# Patient Record
Sex: Male | Born: 1965 | Race: White | Hispanic: No | Marital: Married | Smoking: Never smoker
Health system: Southern US, Community
[De-identification: ages and names within clinical notes are randomized; demographics above are authoritative.]

## PROBLEM LIST (undated history)

## (undated) HISTORY — PX: CHOLECYSTECTOMY: SHX55

## (undated) HISTORY — PX: ANKLE SURGERY: SHX546

## (undated) HISTORY — PX: TONSILLECTOMY: SUR1361

---

## 2018-06-06 ENCOUNTER — Ambulatory Visit: Payer: Self-pay | Admitting: Cardiology

## 2018-06-06 DIAGNOSIS — I77810 Thoracic aortic ectasia: Secondary | ICD-10-CM

## 2018-06-06 DIAGNOSIS — R079 Chest pain, unspecified: Secondary | ICD-10-CM | POA: Diagnosis not present

## 2018-06-06 DIAGNOSIS — E782 Mixed hyperlipidemia: Secondary | ICD-10-CM

## 2018-06-06 DIAGNOSIS — I10 Essential (primary) hypertension: Secondary | ICD-10-CM | POA: Diagnosis not present

## 2018-06-06 DIAGNOSIS — I2510 Atherosclerotic heart disease of native coronary artery without angina pectoris: Secondary | ICD-10-CM

## 2018-06-13 ENCOUNTER — Encounter: Payer: Self-pay | Admitting: Cardiology

## 2018-06-13 ENCOUNTER — Ambulatory Visit (INDEPENDENT_AMBULATORY_CARE_PROVIDER_SITE_OTHER): Payer: 59 | Admitting: Cardiology

## 2018-06-13 VITALS — BP 124/72 | HR 84 | Ht 67.0 in | Wt 200.0 lb

## 2018-06-13 DIAGNOSIS — I10 Essential (primary) hypertension: Secondary | ICD-10-CM

## 2018-06-13 DIAGNOSIS — R0609 Other forms of dyspnea: Secondary | ICD-10-CM

## 2018-06-13 DIAGNOSIS — R0600 Dyspnea, unspecified: Secondary | ICD-10-CM

## 2018-06-13 DIAGNOSIS — E781 Pure hyperglyceridemia: Secondary | ICD-10-CM

## 2018-06-13 HISTORY — DX: Dyspnea, unspecified: R06.00

## 2018-06-13 HISTORY — DX: Essential (primary) hypertension: I10

## 2018-06-13 HISTORY — DX: Other forms of dyspnea: R06.09

## 2018-06-13 MED ORDER — NITROGLYCERIN 0.4 MG SL SUBL: 0.4000 mg | SUBLINGUAL_TABLET | SUBLINGUAL | 11 refills | Status: DC | PRN

## 2018-06-13 MED ORDER — FISH OIL 1000 MG PO CAPS: 2000.0000 mg | ORAL_CAPSULE | Freq: Two times a day (BID) | ORAL | 2 refills | Status: AC

## 2018-06-13 MED ORDER — ASPIRIN EC 81 MG PO TBEC: 81.0000 mg | DELAYED_RELEASE_TABLET | Freq: Every day | ORAL | 3 refills | Status: AC

## 2018-06-13 NOTE — H&P (View-Only) (Signed)
Cardiology Office Note:    Date:  06/13/2018   ID:  Jason Holland, DOB 1965-08-12, MRN [id number]  PCP:  Mikael Spray, NP  Cardiologist:  Garwin Brothers, MD    Referring MD: Mikael Spray, NP    ASSESSMENT:    1. DOE (dyspnea on exertion)   2. Essential hypertension   3. Hypertriglyceridemia    PLAN:    In order of problems listed above:  1. Secondary prevention stressed with the patient.  Importance of compliance with diet and medication stressed and he vocalized understanding.  His blood pressure is stable.  Diet was discussed for dyslipidemia. 2. I reviewed [facility] hospital records extensively.  Patient has significant coronary calcifications on the chest CT.  His symptoms have recurred. 3. Sublingual nitroglycerin prescription was sent, its protocol and 911 protocol explained and the patient vocalized understanding questions were answered to the patient's satisfaction 4. Diet was discussed extensively for dyslipidemia and I prescribed in addition to fish oil 2 g twice daily we will recheck his lipids in the next month or 2. 5. In view of very significant symptoms I discussed with the following. 6. I discussed coronary angiography and left heart catheterization with the patient at extensive length. Procedure, benefits and potential risks were explained. Patient had multiple questions which were answered to the patient's satisfaction. Patient agreed and consented for the procedure. Further recommendations will be made based on the findings of the coronary angiography. In the interim. The patient has any significant symptoms he knows to go to the nearest emergency room. 7. He was advised to take to coated baby aspirin's on a daily basis until his coronary angiography.   Medication Adjustments/Labs and Tests Ordered: Current medicines are reviewed at length with the patient today.  Concerns regarding medicines are outlined above.  No orders of the defined types were placed in  this encounter.  No orders of the defined types were placed in this encounter.    History of Present Illness:    Jason Holland is a 52 y.o. male who is being seen today for the evaluation of chest tightness on exertion at the request of Mikael Spray, NP.  Patient is a pleasant 52 year old male.  He is seen here accompanied by his wife.  He has just of essential hypertension and market hypertriglyceridemia.  He leads a sedentary lifestyle.  Patient mentions to me that he went to the hospital for crushing chest pain.  He called the EMS for this.  He was given nitroglycerin with almost resolution of his symptoms.  Subsequently he went to the hospital and underwent stress testing and this did not reveal any evidence of ischemia.  Patient has significant coronary artery calcification on a CT scan report that is available to me.  His stress test was unremarkable and he was discharged home.  He was found to have a mildly dilated aortic root.  At the time of my evaluation, the patient is alert awake oriented and in no distress.  The patient mentions to me that the symptoms have recurred after hospital discharge but to a milder degree.  History reviewed. No pertinent past medical history.  Past Surgical History:  Procedure Laterality Date  . ANKLE SURGERY    . CHOLECYSTECTOMY    . TONSILLECTOMY      Current Medications: Current Meds  Medication Sig  . albuterol (PROVENTIL HFA;VENTOLIN HFA) 108 (90 Base) MCG/ACT inhaler Inhale 2 puffs into the lungs as needed.  Marland Kitchen atorvastatin (LIPITOR) 40 MG tablet  Take 1 tablet by mouth daily.  . hydrochlorothiazide (HYDRODIURIL) 25 MG tablet Take 1 tablet by mouth daily.  Marland Kitchen. lisinopril (PRINIVIL,ZESTRIL) 20 MG tablet Take 1 tablet by mouth daily.     Allergies:   Patient has no known allergies.   Social History   Socioeconomic History  . Marital status: Married    Spouse name: Not on file  . Number of children: Not on file  . Years of education: Not on  file  . Highest education level: Not on file  Occupational History  . Not on file  Social Needs  . Financial resource strain: Not on file  . Food insecurity:    Worry: Not on file    Inability: Not on file  . Transportation needs:    Medical: Not on file    Non-medical: Not on file  Tobacco Use  . Smoking status: Never Smoker  . Smokeless tobacco: Never Used  Substance and Sexual Activity  . Alcohol use: Not on file  . Drug use: Not on file  . Sexual activity: Not on file  Lifestyle  . Physical activity:    Days per week: Not on file    Minutes per session: Not on file  . Stress: Not on file  Relationships  . Social connections:    Talks on phone: Not on file    Gets together: Not on file    Attends religious service: Not on file    Active member of club or organization: Not on file    Attends meetings of clubs or organizations: Not on file    Relationship status: Not on file  Other Topics Concern  . Not on file  Social History Narrative  . Not on file     Family History: The patient's family history includes Heart Problems in his father and mother; Hypertension in his father and mother.  ROS:   Please see the history of present illness.    All other systems reviewed and are negative.  EKGs/Labs/Other Studies Reviewed:    The following studies were reviewed today: I discussed my findings with the patient at extensive length.  Hospital records were reviewed extensively.   Recent Labs: No results found for requested labs within last 8760 hours.  Recent Lipid Panel No results found for: CHOL, TRIG, HDL, CHOLHDL, VLDL, LDLCALC, LDLDIRECT  Physical Exam:    VS:  BP 124/72 (BP Location: Right Arm, Patient Position: Sitting, Cuff Size: Normal)   Pulse 84   Ht 5\' 7"  (1.702 m)   Wt 200 lb (90.7 kg)   SpO2 99%   BMI 31.32 kg/m     Wt Readings from Last 3 Encounters:  06/13/18 200 lb (90.7 kg)     GEN: Patient is in no acute distress HEENT: Normal NECK: No  JVD; No carotid bruits LYMPHATICS: No lymphadenopathy CARDIAC: S1 S2 regular, 2/6 systolic murmur at the apex. RESPIRATORY:  Clear to auscultation without rales, wheezing or rhonchi  ABDOMEN: Soft, non-tender, non-distended MUSCULOSKELETAL:  No edema; No deformity  SKIN: Warm and dry NEUROLOGIC:  Alert and oriented x 3 PSYCHIATRIC:  Normal affect    Signed, Garwin Brothersajan R Seraj Dunnam, MD  06/13/2018 1:54 PM    [facility]

## 2018-06-13 NOTE — Progress Notes (Signed)
Cardiology Office Note:    Date:  06/13/2018   ID:  Jason Holland, DOB 12/05/1965, MRN [id number]  PCP:  McRae, Lauren, NP  Cardiologist:  Layce Sprung R Asyia Hornung, MD    Referring MD: McRae, Lauren, NP    ASSESSMENT:    1. DOE (dyspnea on exertion)   2. Essential hypertension   3. Hypertriglyceridemia    PLAN:    In order of problems listed above:  1. Secondary prevention stressed with the patient.  Importance of compliance with diet and medication stressed and he vocalized understanding.  His blood pressure is stable.  Diet was discussed for dyslipidemia. 2. I reviewed [place] hospital records extensively.  Patient has significant coronary calcifications on the chest CT.  His symptoms have recurred. 3. Sublingual nitroglycerin prescription was sent, its protocol and 911 protocol explained and the patient vocalized understanding questions were answered to the patient's satisfaction 4. Diet was discussed extensively for dyslipidemia and I prescribed in addition to fish oil 2 g twice daily we will recheck his lipids in the next month or 2. 5. In view of very significant symptoms I discussed with the following. 6. I discussed coronary angiography and left heart catheterization with the patient at extensive length. Procedure, benefits and potential risks were explained. Patient had multiple questions which were answered to the patient's satisfaction. Patient agreed and consented for the procedure. Further recommendations will be made based on the findings of the coronary angiography. In the interim. The patient has any significant symptoms he knows to go to the nearest emergency room. 7. He was advised to take to coated baby aspirin's on a daily basis until his coronary angiography.   Medication Adjustments/Labs and Tests Ordered: Current medicines are reviewed at length with the patient today.  Concerns regarding medicines are outlined above.  No orders of the defined types were placed in  this encounter.  No orders of the defined types were placed in this encounter.    History of Present Illness:    Jason Holland is a 52 y.o. male who is being seen today for the evaluation of chest tightness on exertion at the request of McRae, Lauren, NP.  Patient is a pleasant 52-year-old male.  He is seen here accompanied by his wife.  He has just of essential hypertension and market hypertriglyceridemia.  He leads a sedentary lifestyle.  Patient mentions to me that he went to the hospital for crushing chest pain.  He called the EMS for this.  He was given nitroglycerin with almost resolution of his symptoms.  Subsequently he went to the hospital and underwent stress testing and this did not reveal any evidence of ischemia.  Patient has significant coronary artery calcification on a CT scan report that is available to me.  His stress test was unremarkable and he was discharged home.  He was found to have a mildly dilated aortic root.  At the time of my evaluation, the patient is alert awake oriented and in no distress.  The patient mentions to me that the symptoms have recurred after hospital discharge but to a milder degree.  History reviewed. No pertinent past medical history.  Past Surgical History:  Procedure Laterality Date  . ANKLE SURGERY    . CHOLECYSTECTOMY    . TONSILLECTOMY      Current Medications: Current Meds  Medication Sig  . albuterol (PROVENTIL HFA;VENTOLIN HFA) 108 (90 Base) MCG/ACT inhaler Inhale 2 puffs into the lungs as needed.  . atorvastatin (LIPITOR) 40 MG tablet   Take 1 tablet by mouth daily.  . hydrochlorothiazide (HYDRODIURIL) 25 MG tablet Take 1 tablet by mouth daily.  . lisinopril (PRINIVIL,ZESTRIL) 20 MG tablet Take 1 tablet by mouth daily.     Allergies:   Patient has no known allergies.   Social History   Socioeconomic History  . Marital status: Married    Spouse name: Not on file  . Number of children: Not on file  . Years of education: Not on  file  . Highest education level: Not on file  Occupational History  . Not on file  Social Needs  . Financial resource strain: Not on file  . Food insecurity:    Worry: Not on file    Inability: Not on file  . Transportation needs:    Medical: Not on file    Non-medical: Not on file  Tobacco Use  . Smoking status: Never Smoker  . Smokeless tobacco: Never Used  Substance and Sexual Activity  . Alcohol use: Not on file  . Drug use: Not on file  . Sexual activity: Not on file  Lifestyle  . Physical activity:    Days per week: Not on file    Minutes per session: Not on file  . Stress: Not on file  Relationships  . Social connections:    Talks on phone: Not on file    Gets together: Not on file    Attends religious service: Not on file    Active member of club or organization: Not on file    Attends meetings of clubs or organizations: Not on file    Relationship status: Not on file  Other Topics Concern  . Not on file  Social History Narrative  . Not on file     Family History: The patient's family history includes Heart Problems in his father and mother; Hypertension in his father and mother.  ROS:   Please see the history of present illness.    All other systems reviewed and are negative.  EKGs/Labs/Other Studies Reviewed:    The following studies were reviewed today: I discussed my findings with the patient at extensive length.  Hospital records were reviewed extensively.   Recent Labs: No results found for requested labs within last 8760 hours.  Recent Lipid Panel No results found for: CHOL, TRIG, HDL, CHOLHDL, VLDL, LDLCALC, LDLDIRECT  Physical Exam:    VS:  BP 124/72 (BP Location: Right Arm, Patient Position: Sitting, Cuff Size: Normal)   Pulse 84   Ht 5' 7" (1.702 m)   Wt 200 lb (90.7 kg)   SpO2 99%   BMI 31.32 kg/m     Wt Readings from Last 3 Encounters:  06/13/18 200 lb (90.7 kg)     GEN: Patient is in no acute distress HEENT: Normal NECK: No  JVD; No carotid bruits LYMPHATICS: No lymphadenopathy CARDIAC: S1 S2 regular, 2/6 systolic murmur at the apex. RESPIRATORY:  Clear to auscultation without rales, wheezing or rhonchi  ABDOMEN: Soft, non-tender, non-distended MUSCULOSKELETAL:  No edema; No deformity  SKIN: Warm and dry NEUROLOGIC:  Alert and oriented x 3 PSYCHIATRIC:  Normal affect    Signed, Beni Turrell R Lisa Milian, MD  06/13/2018 1:54 PM    [facility]   

## 2018-06-13 NOTE — Patient Instructions (Signed)
Medication Instructions:  Your physician has recommended you make the following change in your medication:   START 81 mg enteric coated aspirin daily START Nitroglycerin 0.4 mg sublingual (under your tongue) as needed for chest pain. If experiencing chest pain, stop what you are doing and sit down. Take 1 nitroglycerin and wait 5 minutes. If chest pain continues, take another nitroglycerin and wait 5 minutes. If chest pain does not subside, take 1 more nitroglycerin and dial 911. You make take a total of 3 nitroglycerin in a 15 minute time frame. START 2,000 mg fish oil twice daily (this is an over the counter drug)  If you need a refill on your cardiac medications before your next appointment, please call your pharmacy.   Lab work: Your physician recommends that you have the following labs drawn: CBC and BMP today.  If you have labs (blood work) drawn today and your tests are completely normal, you will receive your results only by: Marland Kitchen MyChart Message (if you have MyChart) OR . A paper copy in the mail If you have any lab test that is abnormal or we need to change your treatment, we will call you to review the results.  Testing/Procedures:    [facility] CARDIOVASCULAR DIVISION [facility] AT [place] [address] [place] [address] Dept: [phone] Loc: [phone]  Jason Holland  06/13/2018  You are scheduled for a Cardiac Catheterization on Tuesday, December 3 with Dr. Verne Carrow.  1. Please arrive at the [facility] (Main Entrance A) at [facility]: [address] [place], [place] [address] at 7:00 AM (This time is two hours before your procedure to ensure your preparation). Free valet parking service is available.   Special note: Every effort is made to have your procedure done on time. Please understand that emergencies sometimes delay scheduled procedures.  2. Diet: Do not eat solid foods after midnight.  The  patient may have clear liquids until 5am upon the day of the procedure.  3. Labs: To be done today.  4. Medication instructions in preparation for your procedure:   Contrast Allergy: No  Stop taking, HTCZ (Hydrochlorothiazide) Tuesday, December 3,  On the morning of your procedure, take your Aspirin and any morning medicines NOT listed above.  You may use sips of water.  5. Plan for one night stay--bring personal belongings. 6. Bring a current list of your medications and current insurance cards. 7. You MUST have a responsible person to drive you home. 8. Someone MUST be with you the first 24 hours after you arrive home or your discharge will be delayed. 9. Please wear clothes that are easy to get on and off and wear slip-on shoes.  Thank you for allowing [place] to care for you!   -- [facility]   Follow-Up: At [facility], you and your health needs are our priority.  As part of our continuing mission to provide you with exceptional heart care, we have created designated Provider Care Teams.  These Care Teams include your primary Cardiologist (physician) and Advanced Practice Providers (APPs -  Physician Assistants and Nurse Practitioners) who all work together to provide you with the care you need, when you need it.  You will need a follow up appointment in 4 weeks.  Please call our office 2 months in advance to schedule this appointment.  You may see another member of our [employer] Provider Team in [place]: Gypsy Balsam, MD . Arlys John  Dulce Sellar, MD  Any Other Special Instructions Will Be Listed Below (If Applicable).   Coronary Angiogram With Stent Coronary angiogram with stent placement is a procedure to widen or open a narrow blood vessel of the heart (coronary artery). Arteries may become blocked by cholesterol buildup (plaques) in the lining of the wall. When a coronary artery becomes partially blocked, blood flow to that area decreases. This  may lead to chest pain or a heart attack (myocardial infarction). A stent is a small piece of metal that looks like mesh or a spring. Stent placement may be done as treatment for a heart attack or right after a coronary angiogram in which a blocked artery is found. Let your health care provider know about:  Any allergies you have.  All medicines you are taking, including vitamins, herbs, eye drops, creams, and over-the-counter medicines.  Any problems you or family members have had with anesthetic medicines.  Any blood disorders you have.  Any surgeries you have had.  Any medical conditions you have.  Whether you are pregnant or may be pregnant. What are the risks? Generally, this is a safe procedure. However, problems may occur, including:  Damage to the heart or its blood vessels.  A return of blockage.  Bleeding, infection, or bruising at the insertion site.  A collection of blood under the skin (hematoma) at the insertion site.  A blood clot in another part of the body.  Kidney injury.  Allergic reaction to the dye or contrast that is used.  Bleeding into the abdomen (retroperitoneal bleeding).  What happens before the procedure? Staying hydrated Follow instructions from your health care provider about hydration, which may include:  Up to 2 hours before the procedure - you may continue to drink clear liquids, such as water, clear fruit juice, black coffee, and plain tea.  Eating and drinking restrictions Follow instructions from your health care provider about eating and drinking, which may include:  8 hours before the procedure - stop eating heavy meals or foods such as meat, fried foods, or fatty foods.  6 hours before the procedure - stop eating light meals or foods, such as toast or cereal.  2 hours before the procedure - stop drinking clear liquids.  Ask your health care provider about:  Changing or stopping your regular medicines. This is especially  important if you are taking diabetes medicines or blood thinners.  Taking medicines such as ibuprofen. These medicines can thin your blood. Do not take these medicines before your procedure if your health care provider instructs you not to. Generally, aspirin is recommended before a procedure of passing a small, thin tube (catheter) through a blood vessel and into the heart (cardiac catheterization).  What happens during the procedure?  An IV tube will be inserted into one of your veins.  You will be given one or more of the following: ? A medicine to help you relax (sedative). ? A medicine to numb the area where the catheter will be inserted into an artery (local anesthetic).  To reduce your risk of infection: ? Your health care team will wash or sanitize their hands. ? Your skin will be washed with soap. ? Hair may be removed from the area where the catheter will be inserted.  Using a guide wire, the catheter will be inserted into an artery. The location may be in your groin, in your wrist, or in the fold of your arm (near your elbow).  A type of X-ray (fluoroscopy) will be  used to help guide the catheter to the opening of the arteries in the heart.  A dye will be injected into the catheter, and X-rays will be taken. The dye will help to show where any narrowing or blockages are located in the arteries.  A tiny wire will be guided to the blocked spot, and a balloon will be inflated to make the artery wider.  The stent will be expanded and will crush the plaques into the wall of the vessel. The stent will hold the area open and improve the blood flow. Most stents have a drug coating to reduce the risk of the stent narrowing over time.  The artery may be made wider using a drill, laser, or other tools to remove plaques.  When the blood flow is better, the catheter will be removed. The lining of the artery will grow over the stent, which stays where it was placed. This procedure may vary  among health care providers and hospitals. What happens after the procedure?  If the procedure is done through the leg, you will be kept in bed lying flat for about 6 hours. You will be instructed to not bend and not cross your legs.  The insertion site will be checked frequently.  The pulse in your foot or wrist will be checked frequently.  You may have additional blood tests, X-rays, and a test that records the electrical activity of your heart (electrocardiogram, or ECG). This information is not intended to replace advice given to you by your health care provider. Make sure you discuss any questions you have with your health care provider. Document Released: 01/08/2003 Document Revised: 03/03/2016 Document Reviewed: 02/07/2016 Elsevier Interactive Patient Education  2018 [employer].    Aspirin and Your Heart Aspirin is a medicine that affects the way blood clots. Aspirin can be used to help reduce the risk of blood clots, heart attacks, and other heart-related problems. Should I take aspirin? Your health care provider will help you determine whether it is safe and beneficial for you to take aspirin daily. Taking aspirin daily may be beneficial if you:  Have had a heart attack or chest pain.  Have undergone open heart surgery such as coronary artery bypass surgery (CABG).  Have had coronary angioplasty.  Have experienced a stroke or transient ischemic attack (TIA).  Have peripheral vascular disease (PVD).  Have chronic heart rhythm problems such as atrial fibrillation.  Are there any risks of taking aspirin daily? Daily use of aspirin can increase your risk of side effects. Some of these include:  Bleeding. Bleeding problems can be minor or serious. An example of a minor problem is a cut that does not stop bleeding. An example of a more serious problem is stomach bleeding or bleeding into the brain. Your risk of bleeding is increased if you are also taking non-steroidal  anti-inflammatory medicine (NSAIDs).  Increased bruising.  Upset stomach.  An allergic reaction. People who have nasal polyps have an increased risk of developing an aspirin allergy.  What are some guidelines I should follow when taking aspirin?  Take aspirin only as directed by your health care provider. Make sure you understand how much you should take and what form you should take. The two forms of aspirin are: ? Non-enteric-coated. This type of aspirin does not have a coating and is absorbed quickly. Non-enteric-coated aspirin is usually recommended for people with chest pain. This type of aspirin also comes in a chewable form. ? Enteric-coated. This type of aspirin  has a special coating that releases the medicine very slowly. Enteric-coated aspirin causes less stomach upset than non-enteric-coated aspirin. This type of aspirin should not be chewed or crushed.  Drink alcohol in moderation. Drinking alcohol increases your risk of bleeding. When should I seek medical care?  You have unusual bleeding or bruising.  You have stomach pain.  You have an allergic reaction. Symptoms of an allergic reaction include: ? Hives. ? Itchy skin. ? Swelling of the lips, tongue, or face.  You have ringing in your ears. When should I seek immediate medical care?  Your bowel movements are bloody, dark red, or black in color.  You vomit or cough up blood.  You have blood in your urine.  You cough, wheeze, or feel short of breath. If you have any of the following symptoms, this is an emergency. Do not wait to see if the pain will go away. Get medical help at once. Call your local emergency services (911 in the [place] Do not drive yourself to the hospital.  You have severe chest pain, especially if the pain is crushing or pressure-like and spreads to the arms, back, neck, or jaw.  You have stroke-like symptoms, such as: ? Loss of vision. ? Difficulty talking. ? Numbness or weakness on one side  of your body. ? Numbness or weakness in your arm or leg. ? Not thinking clearly or feeling confused.  This information is not intended to replace advice given to you by your health care provider. Make sure you discuss any questions you have with your health care provider. Document Released: 06/16/2008 Document Revised: 11/11/2015 Document Reviewed: 10/09/2013 Elsevier Interactive Patient Education  2018 [employer]. Nitroglycerin sublingual tablets What is this medicine? NITROGLYCERIN (nye troe GLI ser in) is a type of vasodilator. It relaxes blood vessels, increasing the blood and oxygen supply to your heart. This medicine is used to relieve chest pain caused by angina. It is also used to prevent chest pain before activities like climbing stairs, going outdoors in cold weather, or sexual activity. This medicine may be used for other purposes; ask your health care provider or pharmacist if you have questions. COMMON BRAND NAME(S): Nitroquick, Nitrostat, Nitrotab What should I tell my health care provider before I take this medicine? They need to know if you have any of these conditions: -anemia -head injury, recent stroke, or bleeding in the brain -liver disease -previous heart attack -an unusual or allergic reaction to nitroglycerin, other medicines, foods, dyes, or preservatives -pregnant or trying to get pregnant -breast-feeding How should I use this medicine? Take this medicine by mouth as needed. At the first sign of an angina attack (chest pain or tightness) place one tablet under your tongue. You can also take this medicine 5 to 10 minutes before an event likely to produce chest pain. Follow the directions on the prescription label. Let the tablet dissolve under the tongue. Do not swallow whole. Replace the dose if you accidentally swallow it. It will help if your mouth is not dry. Saliva around the tablet will help it to dissolve more quickly. Do not eat or drink, smoke or chew  tobacco while a tablet is dissolving. If you are not better within 5 minutes after taking ONE dose of nitroglycerin, call 9-1-1 immediately to seek emergency medical care. Do not take more than 3 nitroglycerin tablets over 15 minutes. If you take this medicine often to relieve symptoms of angina, your doctor or health care professional may provide you with different  instructions to manage your symptoms. If symptoms do not go away after following these instructions, it is important to call 9-1-1 immediately. Do not take more than 3 nitroglycerin tablets over 15 minutes. Talk to your pediatrician regarding the use of this medicine in children. Special care may be needed. Overdosage: If you think you have taken too much of this medicine contact a poison control center or emergency room at once. NOTE: This medicine is only for you. Do not share this medicine with others. What if I miss a dose? This does not apply. This medicine is only used as needed. What may interact with this medicine? Do not take this medicine with any of the following medications: -certain migraine medicines like ergotamine and dihydroergotamine (DHE) -medicines used to treat erectile dysfunction like sildenafil, tadalafil, and vardenafil -riociguat This medicine may also interact with the following medications: -alteplase -aspirin -heparin -medicines for high blood pressure -medicines for mental depression -other medicines used to treat angina -phenothiazines like chlorpromazine, mesoridazine, prochlorperazine, thioridazine This list may not describe all possible interactions. Give your health care provider a list of all the medicines, herbs, non-prescription drugs, or dietary supplements you use. Also tell them if you smoke, drink alcohol, or use illegal drugs. Some items may interact with your medicine. What should I watch for while using this medicine? Tell your doctor or health care professional if you feel your medicine  is no longer working. Keep this medicine with you at all times. Sit or lie down when you take your medicine to prevent falling if you feel dizzy or faint after using it. Try to remain calm. This will help you to feel better faster. If you feel dizzy, take several deep breaths and lie down with your feet propped up, or bend forward with your head resting between your knees. You may get drowsy or dizzy. Do not drive, use machinery, or do anything that needs mental alertness until you know how this drug affects you. Do not stand or sit up quickly, especially if you are an older patient. This reduces the risk of dizzy or fainting spells. Alcohol can make you more drowsy and dizzy. Avoid alcoholic drinks. Do not treat yourself for coughs, colds, or pain while you are taking this medicine without asking your doctor or health care professional for advice. Some ingredients may increase your blood pressure. What side effects may I notice from receiving this medicine? Side effects that you should report to your doctor or health care professional as soon as possible: -blurred vision -dry mouth -skin rash -sweating -the feeling of extreme pressure in the head -unusually weak or tired Side effects that usually do not require medical attention (report to your doctor or health care professional if they continue or are bothersome): -flushing of the face or neck -headache -irregular heartbeat, palpitations -nausea, vomiting This list may not describe all possible side effects. Call your doctor for medical advice about side effects. You may report side effects to FDA at 1-800-FDA-1088. Where should I keep my medicine? Keep out of the reach of children. Store at room temperature between 20 and 25 degrees C (68 and 77 degrees F). Store in Retail buyeroriginal container. Protect from light and moisture. Keep tightly closed. Throw away any unused medicine after the expiration date. NOTE: This sheet is a summary. It may not cover  all possible information. If you have questions about this medicine, talk to your doctor, pharmacist, or health care provider.  2018 Elsevier/Gold Standard (2013-05-02 17:57:36)

## 2018-06-13 NOTE — Addendum Note (Signed)
Addended by: Craige CottaANDERSON, Ilhan Madan S on: 06/13/2018 02:12 PM   Modules accepted: Orders

## 2018-06-14 LAB — CBC WITH DIFFERENTIAL/PLATELET
BASOS: 0 %
Basophils Absolute: 0 10*3/uL (ref 0.0–0.2)
EOS (ABSOLUTE): 0.1 10*3/uL (ref 0.0–0.4)
Eos: 1 %
Hematocrit: 43.2 % (ref 37.5–51.0)
Hemoglobin: 15 g/dL (ref 13.0–17.7)
Immature Grans (Abs): 0 10*3/uL (ref 0.0–0.1)
Immature Granulocytes: 0 %
LYMPHS ABS: 1.7 10*3/uL (ref 0.7–3.1)
Lymphs: 17 %
MCH: 30.7 pg (ref 26.6–33.0)
MCHC: 34.7 g/dL (ref 31.5–35.7)
MCV: 88 fL (ref 79–97)
MONOS ABS: 0.5 10*3/uL (ref 0.1–0.9)
Monocytes: 5 %
NEUTROS ABS: 7.4 10*3/uL — AB (ref 1.4–7.0)
Neutrophils: 77 %
Platelets: 271 10*3/uL (ref 150–450)
RBC: 4.89 x10E6/uL (ref 4.14–5.80)
RDW: 12.6 % (ref 12.3–15.4)
WBC: 9.8 10*3/uL (ref 3.4–10.8)

## 2018-06-14 LAB — BASIC METABOLIC PANEL
BUN / CREAT RATIO: 28 — AB (ref 9–20)
BUN: 29 mg/dL — ABNORMAL HIGH (ref 6–24)
CO2: 28 mmol/L (ref 20–29)
CREATININE: 1.04 mg/dL (ref 0.76–1.27)
Calcium: 10.2 mg/dL (ref 8.7–10.2)
Chloride: 97 mmol/L (ref 96–106)
GFR calc non Af Amer: 81 mL/min/{1.73_m2} (ref >59)
GFR, EST AFRICAN AMERICAN: 94 mL/min/{1.73_m2} (ref >59)
Glucose: 106 mg/dL — ABNORMAL HIGH (ref 65–99)
POTASSIUM: 4.7 mmol/L (ref 3.5–5.2)
SODIUM: 142 mmol/L (ref 134–144)

## 2018-06-18 ENCOUNTER — Telehealth: Payer: Self-pay

## 2018-06-18 ENCOUNTER — Telehealth: Payer: Self-pay | Admitting: *Deleted

## 2018-06-18 NOTE — Telephone Encounter (Signed)
Pt [facility]: Tuesday June 19, 2018 9 AM [facility] at: 7 AM  No solid food after midnight prior to cath, clear liquids until 5 AM day of procedure. Contrast allergy: no  Hold: HCTZ-AM of procedure.  Except hold medications AM meds can be  taken pre-cath with sip of water including: ASA 81 mg  Confirmed patient has responsible person to drive home post procedure and for 24 hours after you arrive home: yes

## 2018-06-18 NOTE — Telephone Encounter (Signed)
-----   Message from Garwin Brothersajan R Revankar, MD sent at 06/18/2018 10:54 AM EST ----- The results of the study is unremarkable. Please inform patient. I will discuss in detail at next appointment. Cc  primary care/referring physician Garwin Brothersajan R Revankar, MD 06/18/2018 10:54 AM

## 2018-06-18 NOTE — Telephone Encounter (Signed)
Patient called and informed of lab results.

## 2018-06-19 ENCOUNTER — Ambulatory Visit (HOSPITAL_COMMUNITY)
Admission: RE | Admit: 2018-06-19 | Discharge: 2018-06-19 | Disposition: A | Discharge status: Home / Self Care | Payer: 59 | Source: Ambulatory Visit | Attending: Cardiovascular Disease | Admitting: Cardiovascular Disease

## 2018-06-19 ENCOUNTER — Encounter (HOSPITAL_COMMUNITY)
Admission: RE | Disposition: A | Discharge status: Home / Self Care | Payer: Self-pay | Source: Ambulatory Visit | Attending: Cardiovascular Disease

## 2018-06-19 ENCOUNTER — Other Ambulatory Visit: Payer: Self-pay

## 2018-06-19 ENCOUNTER — Encounter (HOSPITAL_COMMUNITY): Payer: Self-pay | Admitting: Cardiovascular Disease

## 2018-06-19 DIAGNOSIS — E781 Pure hyperglyceridemia: Secondary | ICD-10-CM | POA: Insufficient documentation

## 2018-06-19 DIAGNOSIS — E785 Hyperlipidemia, unspecified: Secondary | ICD-10-CM | POA: Insufficient documentation

## 2018-06-19 DIAGNOSIS — R072 Precordial pain: Secondary | ICD-10-CM

## 2018-06-19 DIAGNOSIS — R0609 Other forms of dyspnea: Secondary | ICD-10-CM

## 2018-06-19 DIAGNOSIS — I10 Essential (primary) hypertension: Secondary | ICD-10-CM

## 2018-06-19 DIAGNOSIS — I25110 Atherosclerotic heart disease of native coronary artery with unstable angina pectoris: Secondary | ICD-10-CM | POA: Diagnosis not present

## 2018-06-19 DIAGNOSIS — Z8249 Family history of ischemic heart disease and other diseases of the circulatory system: Secondary | ICD-10-CM | POA: Diagnosis not present

## 2018-06-19 HISTORY — PX: LEFT HEART CATH AND CORONARY ANGIOGRAPHY: CATH118249

## 2018-06-19 SURGERY — LEFT HEART CATH AND CORONARY ANGIOGRAPHY
Anesthesia: LOCAL

## 2018-06-19 MED ORDER — HEPARIN (PORCINE) IN NACL 1000-0.9 UT/500ML-% IV SOLN
INTRAVENOUS | Status: AC
  Filled 2018-06-19: qty 500

## 2018-06-19 MED ORDER — MIDAZOLAM HCL 2 MG/2ML IJ SOLN
INTRAMUSCULAR | Status: AC
  Filled 2018-06-19: qty 2

## 2018-06-19 MED ORDER — SODIUM CHLORIDE 0.9% FLUSH: 3.0000 mL | INTRAVENOUS | Status: DC | PRN

## 2018-06-19 MED ORDER — HEPARIN (PORCINE) IN NACL 1000-0.9 UT/500ML-% IV SOLN
INTRAVENOUS | Status: DC | PRN
  Administered 2018-06-19: 500 mL

## 2018-06-19 MED ORDER — SODIUM CHLORIDE 0.9% FLUSH: 3.0000 mL | Freq: Two times a day (BID) | INTRAVENOUS | Status: DC

## 2018-06-19 MED ORDER — ONDANSETRON HCL 4 MG/2ML IJ SOLN: 4.0000 mg | Freq: Four times a day (QID) | INTRAMUSCULAR | Status: DC | PRN

## 2018-06-19 MED ORDER — HEPARIN (PORCINE) IN NACL 1000-0.9 UT/500ML-% IV SOLN
INTRAVENOUS | Status: AC
  Filled 2018-06-19: qty 1000

## 2018-06-19 MED ORDER — SODIUM CHLORIDE 0.9 % IV SOLN: INTRAVENOUS | Status: AC

## 2018-06-19 MED ORDER — HEPARIN SODIUM (PORCINE) 1000 UNIT/ML IJ SOLN
INTRAMUSCULAR | Status: AC
  Filled 2018-06-19: qty 1

## 2018-06-19 MED ORDER — SODIUM CHLORIDE 0.9 % IV SOLN: 250.0000 mL | INTRAVENOUS | Status: DC | PRN

## 2018-06-19 MED ORDER — VERAPAMIL HCL 2.5 MG/ML IV SOLN
INTRAVENOUS | Status: AC
  Filled 2018-06-19: qty 2

## 2018-06-19 MED ORDER — HEPARIN SODIUM (PORCINE) 1000 UNIT/ML IJ SOLN
INTRAMUSCULAR | Status: DC | PRN
  Administered 2018-06-19: 4500 [IU] via INTRAVENOUS

## 2018-06-19 MED ORDER — FENTANYL CITRATE (PF) 100 MCG/2ML IJ SOLN
INTRAMUSCULAR | Status: DC | PRN
  Administered 2018-06-19: 50 ug via INTRAVENOUS

## 2018-06-19 MED ORDER — LIDOCAINE HCL (PF) 1 % IJ SOLN
INTRAMUSCULAR | Status: AC
  Filled 2018-06-19: qty 30

## 2018-06-19 MED ORDER — SODIUM CHLORIDE 0.9 % WEIGHT BASED INFUSION: 1.0000 mL/kg/h | INTRAVENOUS | Status: DC

## 2018-06-19 MED ORDER — FENTANYL CITRATE (PF) 100 MCG/2ML IJ SOLN
INTRAMUSCULAR | Status: AC
  Filled 2018-06-19: qty 2

## 2018-06-19 MED ORDER — LIDOCAINE HCL (PF) 1 % IJ SOLN
INTRAMUSCULAR | Status: DC | PRN
  Administered 2018-06-19: 2 mL

## 2018-06-19 MED ORDER — IOHEXOL 350 MG/ML SOLN
INTRAVENOUS | Status: DC | PRN
  Administered 2018-06-19: 105 mL via INTRA_ARTERIAL

## 2018-06-19 MED ORDER — ACETAMINOPHEN 325 MG PO TABS: 650.0000 mg | ORAL_TABLET | ORAL | Status: DC | PRN

## 2018-06-19 MED ORDER — ASPIRIN 81 MG PO CHEW: 81.0000 mg | CHEWABLE_TABLET | ORAL | Status: DC

## 2018-06-19 MED ORDER — MIDAZOLAM HCL 2 MG/2ML IJ SOLN
INTRAMUSCULAR | Status: DC | PRN
  Administered 2018-06-19: 2 mg via INTRAVENOUS

## 2018-06-19 MED ORDER — VERAPAMIL HCL 2.5 MG/ML IV SOLN
INTRAVENOUS | Status: DC | PRN
  Administered 2018-06-19: 10 mL via INTRA_ARTERIAL

## 2018-06-19 MED ORDER — SODIUM CHLORIDE 0.9 % WEIGHT BASED INFUSION
3.0000 mL/kg/h | INTRAVENOUS | Status: AC
  Administered 2018-06-19: 3 mL/kg/h via INTRAVENOUS

## 2018-06-19 SURGICAL SUPPLY — 10 items
CATH 5FR JL3.5 JR4 ANG PIG MP (CATHETERS) ×1 IMPLANT
DEVICE RAD COMP TR BAND LRG (VASCULAR PRODUCTS) ×1 IMPLANT
GLIDESHEATH SLEND SS 6F .021 (SHEATH) ×1 IMPLANT
GUIDEWIRE INQWIRE 1.5J.035X260 (WIRE) IMPLANT
INQWIRE 1.5J .035X260CM (WIRE) ×2
KIT HEART LEFT (KITS) ×2 IMPLANT
PACK CARDIAC CATHETERIZATION (CUSTOM PROCEDURE TRAY) ×2 IMPLANT
SYR MEDRAD MARK 7 150ML (SYRINGE) ×2 IMPLANT
TRANSDUCER W/STOPCOCK (MISCELLANEOUS) ×2 IMPLANT
TUBING CIL FLEX 10 FLL-RA (TUBING) ×2 IMPLANT

## 2018-06-19 NOTE — Discharge Instructions (Signed)

## 2018-06-19 NOTE — Interval H&P Note (Signed)
History and Physical Interval Note:  06/19/2018 8:23 AM  Jason Holland  has presented today for cardiac cath with the diagnosis of CAD, unstable angina. The various methods of treatment have been discussed with the patient and family. After consideration of risks, benefits and other options for treatment, the patient has consented to  Procedure(s): LEFT HEART CATH AND CORONARY ANGIOGRAPHY (N/A) as a surgical intervention .  The patient's history has been reviewed, patient examined, no change in status, stable for surgery.  I have reviewed the patient's chart and labs.  Questions were answered to the patient's satisfaction.    Cath Lab Visit (complete for each Cath Lab visit)  Clinical Evaluation Leading to the Procedure:   ACS: No.  Non-ACS:    Anginal Classification: CCS III  Anti-ischemic medical therapy: No Therapy  Non-Invasive Test Results: Low-risk stress test findings: cardiac mortality <1%/year  Prior CABG: No previous CABG        Verne Carrowhristopher McAlhany

## 2018-06-21 MED FILL — Heparin Sod (Porcine)-NaCl IV Soln 1000 Unit/500ML-0.9%: INTRAVENOUS | Qty: 1000 | Status: AC

## 2018-07-17 ENCOUNTER — Ambulatory Visit (INDEPENDENT_AMBULATORY_CARE_PROVIDER_SITE_OTHER): Payer: 59 | Admitting: Cardiology

## 2018-07-17 ENCOUNTER — Encounter: Payer: Self-pay | Admitting: Cardiology

## 2018-07-17 VITALS — BP 132/82 | HR 77 | Ht 67.0 in | Wt 197.0 lb

## 2018-07-17 DIAGNOSIS — E781 Pure hyperglyceridemia: Secondary | ICD-10-CM

## 2018-07-17 DIAGNOSIS — I2510 Atherosclerotic heart disease of native coronary artery without angina pectoris: Secondary | ICD-10-CM

## 2018-07-17 DIAGNOSIS — I10 Essential (primary) hypertension: Secondary | ICD-10-CM | POA: Diagnosis not present

## 2018-07-17 HISTORY — DX: Atherosclerotic heart disease of native coronary artery without angina pectoris: I25.10

## 2018-07-17 NOTE — Progress Notes (Signed)
Cardiology Office Note:    Date:  07/17/2018   ID:  Jason OchoaMichael R Kruk, DOB 07/09/1966, MRN [id number]  PCP:  Mikael SprayMcRae, Lauren, NP  Cardiologist:  Garwin Brothersajan R Revankar, MD   Referring MD: Mikael SprayMcRae, Lauren, NP    ASSESSMENT:    1. Atherosclerosis of native coronary artery of native heart without angina pectoris   2. Essential hypertension   3. Hypertriglyceridemia    PLAN:    In order of problems listed above:  1. Secondary prevention stressed with the patient.  Importance of compliance with diet and medication stressed and he vocalized understanding.  Importance of regular exercise stressed and he promises to do so.  His lipids are followed by his primary care physician.  I told him to refrain from any type of tobacco use. 2. Patient will be seen in follow-up appointment in 6 months or earlier if the patient has any concerns 3. Coronary angiography report was discussed with the patient and questions were answered to his satisfaction.   Medication Adjustments/Labs and Tests Ordered: Current medicines are reviewed at length with the patient today.  Concerns regarding medicines are outlined above.  No orders of the defined types were placed in this encounter.  No orders of the defined types were placed in this encounter.    No chief complaint on file.    History of Present Illness:    Jason Holland is a 52 y.o. male.  Patient has multiple risk factors for coronary artery disease and extensive coronary calcification found on CT scan.  He had very similar episodes of chest pain and some shortness of breath for which I sent him for coronary angiography and fortunately this revealed nonobstructive disease.  The patient denies any problems at this time and is very relieved to know the results of his test.  He tells me that he has quit chewing tobacco.  He is walking on a regular basis and taking statins and aspirin on a regular basis.  At the time of my evaluation, the patient is alert awake  oriented and in no distress.  History reviewed. No pertinent past medical history.  Past Surgical History:  Procedure Laterality Date  . ANKLE SURGERY    . CHOLECYSTECTOMY    . LEFT HEART CATH AND CORONARY ANGIOGRAPHY N/A 06/19/2018   Procedure: LEFT HEART CATH AND CORONARY ANGIOGRAPHY;  Surgeon: Kathleene HazelMcAlhany, Christopher D, MD;  Location: [facility];  Service: Cardiovascular;  Laterality: N/A;  . TONSILLECTOMY      Current Medications: Current Meds  Medication Sig  . albuterol (PROVENTIL HFA;VENTOLIN HFA) 108 (90 Base) MCG/ACT inhaler Inhale 2 puffs into the lungs every 6 (six) hours as needed for wheezing or shortness of breath.   Marland Kitchen. aspirin EC 81 MG tablet Take 1 tablet (81 mg total) by mouth daily.  Marland Kitchen. atorvastatin (LIPITOR) 40 MG tablet Take 1 tablet by mouth daily.  . hydrochlorothiazide (HYDRODIURIL) 25 MG tablet Take 1 tablet by mouth daily.  Marland Kitchen. lisinopril (PRINIVIL,ZESTRIL) 20 MG tablet Take 1 tablet by mouth daily.  . nitroGLYCERIN (NITROSTAT) 0.4 MG SL tablet Place 1 tablet (0.4 mg total) under the tongue every 5 (five) minutes as needed.  . Omega-3 Fatty Acids (FISH OIL) 1000 MG CAPS Take 2 capsules (2,000 mg total) by mouth 2 (two) times daily.     Allergies:   Patient has no known allergies.   Social History   Socioeconomic History  . Marital status: Married    Spouse name: Not on file  .  Number of children: Not on file  . Years of education: Not on file  . Highest education level: Not on file  Occupational History  . Not on file  Social Needs  . Financial resource strain: Not on file  . Food insecurity:    Worry: Not on file    Inability: Not on file  . Transportation needs:    Medical: Not on file    Non-medical: Not on file  Tobacco Use  . Smoking status: Never Smoker  . Smokeless tobacco: Never Used  Substance and Sexual Activity  . Alcohol use: Not on file  . Drug use: Not on file  . Sexual activity: Not on file  Lifestyle  . Physical activity:      Days per week: Not on file    Minutes per session: Not on file  . Stress: Not on file  Relationships  . Social connections:    Talks on phone: Not on file    Gets together: Not on file    Attends religious service: Not on file    Active member of club or organization: Not on file    Attends meetings of clubs or organizations: Not on file    Relationship status: Not on file  Other Topics Concern  . Not on file  Social History Narrative  . Not on file     Family History: The patient's family history includes Heart Problems in his father and mother; Hypertension in his father and mother.  ROS:   Please see the history of present illness.    All other systems reviewed and are negative.  EKGs/Labs/Other Studies Reviewed:    The following studies were reviewed today: I discussed my findings with the patient at extensive length.Physicians   Panel Physicians Referring Physician Case Authorizing Physician  Kathleene HazelMcAlhany, Christopher D, MD (Primary)    Procedures   LEFT HEART CATH AND CORONARY ANGIOGRAPHY  Conclusion     Ost LM to Mid LM lesion is 10% stenosed.  Ost LAD to Prox LAD lesion is 20% stenosed.  The left ventricular systolic function is normal.  LV end diastolic pressure is normal.  The left ventricular ejection fraction is 55-65% by visual estimate.  There is no mitral valve regurgitation.   1. Mild non-obstructive plaque in the left main (10%) and proximal LAD (20%).  2. Normal LV systolic function        Recent Labs: 06/13/2018: BUN 29; Creatinine, Ser 1.04; Hemoglobin 15.0; Platelets 271; Potassium 4.7; Sodium 142  Recent Lipid Panel No results found for: CHOL, TRIG, HDL, CHOLHDL, VLDL, LDLCALC, LDLDIRECT  Physical Exam:    VS:  BP 132/82 (BP Location: Right Arm, Patient Position: Sitting, Cuff Size: Normal)   Pulse 77   Ht 5\' 7"  (1.702 m)   Wt 197 lb (89.4 kg)   SpO2 97%   BMI 30.85 kg/m     Wt Readings from Last 3 Encounters:  07/17/18  197 lb (89.4 kg)  06/19/18 199 lb (90.3 kg)  06/13/18 200 lb (90.7 kg)     GEN: Patient is in no acute distress HEENT: Normal NECK: No JVD; No carotid bruits LYMPHATICS: No lymphadenopathy CARDIAC: Hear sounds regular, 2/6 systolic murmur at the apex. RESPIRATORY:  Clear to auscultation without rales, wheezing or rhonchi  ABDOMEN: Soft, non-tender, non-distended MUSCULOSKELETAL:  No edema; No deformity  SKIN: Warm and dry NEUROLOGIC:  Alert and oriented x 3 PSYCHIATRIC:  Normal affect   Signed, Garwin Brothersajan R Revankar, MD  07/17/2018 2:27 PM  [facility]

## 2018-07-17 NOTE — Patient Instructions (Signed)
Medication Instructions:  Your physician recommends that you continue on your current medications as directed. Please refer to the Current Medication list given to you today.  If you need a refill on your cardiac medications before your next appointment, please call your pharmacy.   Lab work: None  If you have labs (blood work) drawn today and your tests are completely normal, you will receive your results only by: . MyChart Message (if you have MyChart) OR . A paper copy in the mail If you have any lab test that is abnormal or we need to change your treatment, we will call you to review the results.  Testing/Procedures: None  Follow-Up: At [facility], you and your health needs are our priority.  As part of our continuing mission to provide you with exceptional heart care, we have created designated Provider Care Teams.  These Care Teams include your primary Cardiologist (physician) and Advanced Practice Providers (APPs -  Physician Assistants and Nurse Practitioners) who all work together to provide you with the care you need, when you need it. You will need a follow up appointment in 9 months.  Please call our office 2 months in advance to schedule this appointment.  You may see No primary care provider on file. or another member of our [facility] Provider Team in [place]: Robert Krasowski, MD . Brian Munley, MD  Any Other Special Instructions Will Be Listed Below (If Applicable).    

## 2018-07-19 ENCOUNTER — Ambulatory Visit: Payer: 59 | Admitting: Cardiology

## 2018-08-15 ENCOUNTER — Telehealth: Payer: Self-pay | Admitting: *Deleted

## 2018-08-15 NOTE — Telephone Encounter (Signed)
Brittney: Medical illustrator from [place] called to let pt know that he is welcome to reach out to her for any help he may need. The number she had is different from ours so has been unable to reach pt. I did not give out different number without pt's okay.

## 2019-04-24 ENCOUNTER — Other Ambulatory Visit: Payer: Self-pay

## 2019-04-24 ENCOUNTER — Ambulatory Visit (INDEPENDENT_AMBULATORY_CARE_PROVIDER_SITE_OTHER): Payer: 59 | Admitting: Cardiology

## 2019-04-24 ENCOUNTER — Encounter: Payer: Self-pay | Admitting: Cardiology

## 2019-04-24 VITALS — BP 120/70 | HR 85 | Ht 67.0 in | Wt 197.2 lb

## 2019-04-24 DIAGNOSIS — I77810 Thoracic aortic ectasia: Secondary | ICD-10-CM

## 2019-04-24 DIAGNOSIS — I2510 Atherosclerotic heart disease of native coronary artery without angina pectoris: Secondary | ICD-10-CM

## 2019-04-24 DIAGNOSIS — Z13220 Encounter for screening for lipoid disorders: Secondary | ICD-10-CM | POA: Diagnosis not present

## 2019-04-24 DIAGNOSIS — I10 Essential (primary) hypertension: Secondary | ICD-10-CM

## 2019-04-24 DIAGNOSIS — Z1329 Encounter for screening for other suspected endocrine disorder: Secondary | ICD-10-CM

## 2019-04-24 HISTORY — DX: Thoracic aortic ectasia: I77.810

## 2019-04-24 NOTE — Progress Notes (Signed)
Cardiology Office Note:    Date:  04/24/2019   ID:  Jason Holland, DOB 06-24-1966, MRN [id number]  PCP:  Mikael Spray, NP  Cardiologist:  Garwin Brothers, MD   Referring MD: Mikael Spray, NP    ASSESSMENT:    1. Atherosclerosis of native coronary artery of native heart without angina pectoris   2. Essential hypertension   3. Ascending aorta dilatation (HCC)    PLAN:    In order of problems listed above:  1. Coronary artery disease: Secondary prevention stressed with the patient.  Importance of compliance with diet and medication stressed and he vocalized understanding. 2. Essential hypertension: Blood pressure is stable 3. Mixed dyslipidemia: Diet was discussed importance of regular exercise stressed and he will be back in the next few days for fasting blood work including lipids importance of regular exercise stressed 4. His ascending aorta was dilated to about 4 cm and will do a repeat CT scan to assess this. 5. Patient will be seen in follow-up appointment in 6 months or earlier if the patient has any concerns    Medication Adjustments/Labs and Tests Ordered: Current medicines are reviewed at length with the patient today.  Concerns regarding medicines are outlined above.  No orders of the defined types were placed in this encounter.  No orders of the defined types were placed in this encounter.    Chief Complaint  Patient presents with  . Follow-up     History of Present Illness:    Jason Holland is a 53 y.o. male.  Patient has past medical history of coronary artery disease nonobstructive in nature.  He has had significant atherosclerotic calcifications in his coronary arteries and is on secondary prevention.  He also has an aortic ascending dilatation which was approximately 4 cm.  He is due for rechecking it.  No chest pain orthopnea or PND.  He leads a sedentary lifestyle.  At the time of my evaluation, the patient is alert awake oriented and in no  distress.  No past medical history on file.  Past Surgical History:  Procedure Laterality Date  . ANKLE SURGERY    . CHOLECYSTECTOMY    . LEFT HEART CATH AND CORONARY ANGIOGRAPHY N/A 06/19/2018   Procedure: LEFT HEART CATH AND CORONARY ANGIOGRAPHY;  Surgeon: Kathleene Hazel, MD;  Location: [facility];  Service: Cardiovascular;  Laterality: N/A;  . TONSILLECTOMY      Current Medications: Current Meds  Medication Sig  . albuterol (PROVENTIL HFA;VENTOLIN HFA) 108 (90 Base) MCG/ACT inhaler Inhale 2 puffs into the lungs every 6 (six) hours as needed for wheezing or shortness of breath.   Marland Kitchen aspirin EC 81 MG tablet Take 1 tablet (81 mg total) by mouth daily.  Marland Kitchen atorvastatin (LIPITOR) 40 MG tablet Take 1 tablet by mouth daily.  . hydrochlorothiazide (HYDRODIURIL) 25 MG tablet Take 1 tablet by mouth daily.  Marland Kitchen lisinopril (PRINIVIL,ZESTRIL) 20 MG tablet Take 1 tablet by mouth daily.  . nitroGLYCERIN (NITROSTAT) 0.4 MG SL tablet Place 1 tablet (0.4 mg total) under the tongue every 5 (five) minutes as needed.  . Omega-3 Fatty Acids (FISH OIL) 1000 MG CAPS Take 2 capsules (2,000 mg total) by mouth 2 (two) times daily.     Allergies:   Patient has no known allergies.   Social History   Socioeconomic History  . Marital status: Married    Spouse name: Not on file  . Number of children: Not on file  . Years of education:  Not on file  . Highest education level: Not on file  Occupational History  . Not on file  Social Needs  . Financial resource strain: Not on file  . Food insecurity    Worry: Not on file    Inability: Not on file  . Transportation needs    Medical: Not on file    Non-medical: Not on file  Tobacco Use  . Smoking status: Never Smoker  . Smokeless tobacco: Never Used  Substance and Sexual Activity  . Alcohol use: Not on file  . Drug use: Not on file  . Sexual activity: Not on file  Lifestyle  . Physical activity    Days per week: Not on file    Minutes  per session: Not on file  . Stress: Not on file  Relationships  . Social Herbalist on phone: Not on file    Gets together: Not on file    Attends religious service: Not on file    Active member of club or organization: Not on file    Attends meetings of clubs or organizations: Not on file    Relationship status: Not on file  Other Topics Concern  . Not on file  Social History Narrative  . Not on file     Family History: The patient's family history includes Heart Problems in his father and mother; Hypertension in his father and mother.  ROS:   Please see the history of present illness.    All other systems reviewed and are negative.  EKGs/Labs/Other Studies Reviewed:    The following studies were reviewed today: Study date: 06/19/18  Physicians  Panel Physicians Referring Physician Case Authorizing Physician  Burnell Blanks, MD (Primary)    Procedures  LEFT HEART CATH AND CORONARY ANGIOGRAPHY  Conclusion    Ost LM to Mid LM lesion is 10% stenosed.  Ost LAD to Prox LAD lesion is 20% stenosed.  The left ventricular systolic function is normal.  LV end diastolic pressure is normal.  The left ventricular ejection fraction is 55-65% by visual estimate.  There is no mitral valve regurgitation.   1. Mild non-obstructive plaque in the left main (10%) and proximal LAD (20%).  2. Normal LV systolic function  Recommendations: Medical management of mild CAD with ASA and statin.       Recent Labs: 06/13/2018: BUN 29; Creatinine, Ser 1.04; Hemoglobin 15.0; Platelets 271; Potassium 4.7; Sodium 142  Recent Lipid Panel No results found for: CHOL, TRIG, HDL, CHOLHDL, VLDL, LDLCALC, LDLDIRECT  Physical Exam:    VS:  BP 120/70   Pulse 85   Ht 5\' 7"  (1.702 m)   Wt 197 lb 3.2 oz (89.4 kg)   SpO2 98%   BMI 30.89 kg/m     Wt Readings from Last 3 Encounters:  04/24/19 197 lb 3.2 oz (89.4 kg)  07/17/18 197 lb (89.4 kg)  06/19/18 199 lb (90.3 kg)      GEN: Patient is in no acute distress HEENT: Normal NECK: No JVD; No carotid bruits LYMPHATICS: No lymphadenopathy CARDIAC: Hear sounds regular, 2/6 systolic murmur at the apex. RESPIRATORY:  Clear to auscultation without rales, wheezing or rhonchi  ABDOMEN: Soft, non-tender, non-distended MUSCULOSKELETAL:  No edema; No deformity  SKIN: Warm and dry NEUROLOGIC:  Alert and oriented x 3 PSYCHIATRIC:  Normal affect   Signed, Jenean Lindau, MD  04/24/2019 3:05 PM     [facility]

## 2019-04-24 NOTE — Patient Instructions (Signed)
Medication Instructions:  Your physician recommends that you continue on your current medications as directed. Please refer to the Current Medication list given to you today.  If you need a refill on your cardiac medications before your next appointment, please call your pharmacy.   Lab work: Your physician recommends that you have a BMP, CBC, TSH, hepatic and lipid drawn  If you have labs (blood work) drawn today and your tests are completely normal, you will receive your results only by: Marland Kitchen MyChart Message (if you have MyChart) OR . A paper copy in the mail If you have any lab test that is abnormal or we need to change your treatment, we will call you to review the results.  Testing/Procedures: Non-Cardiac CT scanning, (CAT scanning), is a noninvasive, special x-ray that produces cross-sectional images of the body using x-rays and a computer. CT scans help physicians diagnose and treat medical conditions. For some CT exams, a contrast material is used to enhance visibility in the area of the body being studied. CT scans provide greater clarity and reveal more details than regular x-ray exams.  Follow-Up: At [facility], you and your health needs are our priority.  As part of our continuing mission to provide you with exceptional heart care, we have created designated Provider Care Teams.  These Care Teams include your primary Cardiologist (physician) and Advanced Practice Providers (APPs -  Physician Assistants and Nurse Practitioners) who all work together to provide you with the care you need, when you need it. You will need a follow up appointment in 6 months.    Any Other Special Instructions Will Be Listed Below   CT Angiogram  A CT angiogram is a procedure to look at the blood vessels in various areas of the body. For this procedure, a large X-ray machine, called a CT scanner, takes detailed pictures of blood vessels that have been injected with a dye (contrast material). A CT  angiogram allows your health care provider to see how well blood is flowing to the area of your body that is being checked. Your health care provider will be able to see if there are any problems, such as a blockage. Tell a health care provider about:  Any allergies you have.  All medicines you are taking, including vitamins, herbs, eye drops, creams, and over-the-counter medicines.  Any problems you or family members have had with anesthetic medicines.  Any blood disorders you have.  Any surgeries you have had.  Any medical conditions you have.  Whether you are pregnant or may be pregnant.  Whether you are breastfeeding.  Any anxiety disorders, chronic pain, or other conditions you have that may increase your stress or prevent you from lying still. What are the risks? Generally, this is a safe procedure. However, problems may occur, including:  Infection.  Bleeding.  Allergic reactions to medicines or dyes.  Damage to other structures or organs.  Kidney damage from the dye or contrast that is used.  Increased risk of cancer from radiation exposure. This risk is low. Talk with your health care provider about: ? The risks and benefits of testing. ? How you can receive the lowest dose of radiation. What happens before the procedure?  Wear comfortable clothing and remove any jewelry.  Follow instructions from your health care provider about eating and drinking. For most people, instructions may include these actions: ? For 12 hours before the test, avoid caffeine. This includes tea, coffee, soda, and energy drinks or pills. ? For  3-4 hours before the test, stop eating or drinking anything but water. ? Stay well hydrated by continuing to drink water before the exam. This will help to clear the contrast dye from your body after the test.  Ask your health care provider about changing or stopping your regular medicines. This is especially important if you are taking diabetes  medicines or blood thinners. What happens during the procedure?  An IV tube will be inserted into one of your veins.  You will be asked to lie on an exam table. This table will slide in and out of the CT machine during the procedure.  Contrast dye will be injected into the IV tube. You might feel warm, or you may get a metallic taste in your mouth.  The table that you are lying on will move into the CT machine tunnel for the scan.  The person running the machine will give you instructions while the scans are being done. You may be asked to: ? Keep your arms above your head. ? Hold your breath. ? Stay very still, even if the table is moving.  When the scanning is complete, you will be moved out of the machine.  The IV tube will be removed. The procedure may vary among health care providers and hospitals. What happens after the procedure?  You might feel warm, or you may get a metallic taste in your mouth.  You may be asked to drink water or other fluids to wash (flush) the contrast material out of your body.  It is up to you to get the results of your procedure. Ask your health care provider, or the department that is doing the procedure, when your results will be ready. Summary  A CT angiogram is a procedure to look at the blood vessels in various areas of the body.  You will need to stay very still during the exam.  You may be asked to drink water or other fluids to wash (flush) the contrast material out of your body after your scan. This information is not intended to replace advice given to you by your health care provider. Make sure you discuss any questions you have with your health care provider. Document Released: 03/03/2016 Document Revised: 09/13/2018 Document Reviewed: 03/03/2016 Elsevier Patient Education  2020 [employer].

## 2019-04-24 NOTE — Addendum Note (Signed)
Addended by: Beckey Rutter on: 04/24/2019 03:13 PM   Modules accepted: Orders

## 2019-04-25 ENCOUNTER — Ambulatory Visit: Payer: 59 | Admitting: Cardiology

## 2019-04-26 LAB — HEPATIC FUNCTION PANEL
ALT: 25 IU/L (ref 0–44)
AST: 23 IU/L (ref 0–40)
Albumin: 4.1 g/dL (ref 3.8–4.9)
Alkaline Phosphatase: 64 IU/L (ref 39–117)
Bilirubin Total: 0.5 mg/dL (ref 0.0–1.2)
Bilirubin, Direct: 0.12 mg/dL (ref 0.00–0.40)
Total Protein: 6.9 g/dL (ref 6.0–8.5)

## 2019-04-26 LAB — BASIC METABOLIC PANEL
BUN/Creatinine Ratio: 21 — ABNORMAL HIGH (ref 9–20)
BUN: 26 mg/dL — ABNORMAL HIGH (ref 6–24)
CO2: 27 mmol/L (ref 20–29)
Calcium: 9.2 mg/dL (ref 8.7–10.2)
Chloride: 98 mmol/L (ref 96–106)
Creatinine, Ser: 1.21 mg/dL (ref 0.76–1.27)
GFR calc Af Amer: 77 mL/min/{1.73_m2} (ref >59)
GFR calc non Af Amer: 67 mL/min/{1.73_m2} (ref >59)
Glucose: 98 mg/dL (ref 65–99)
Potassium: 3.7 mmol/L (ref 3.5–5.2)
Sodium: 140 mmol/L (ref 134–144)

## 2019-04-26 LAB — CBC
Hematocrit: 38.9 % (ref 37.5–51.0)
Hemoglobin: 13.6 g/dL (ref 13.0–17.7)
MCH: 30.3 pg (ref 26.6–33.0)
MCHC: 35 g/dL (ref 31.5–35.7)
MCV: 87 fL (ref 79–97)
Platelets: 284 10*3/uL (ref 150–450)
RBC: 4.49 x10E6/uL (ref 4.14–5.80)
RDW: 12.6 % (ref 11.6–15.4)
WBC: 6.4 10*3/uL (ref 3.4–10.8)

## 2019-04-26 LAB — LIPID PANEL
Chol/HDL Ratio: 6.2 ratio — ABNORMAL HIGH (ref 0.0–5.0)
Cholesterol, Total: 148 mg/dL (ref 100–199)
HDL: 24 mg/dL — ABNORMAL LOW (ref >39)
LDL Chol Calc (NIH): 80 mg/dL (ref 0–99)
Triglycerides: 267 mg/dL — ABNORMAL HIGH (ref 0–149)
VLDL Cholesterol Cal: 44 mg/dL — ABNORMAL HIGH (ref 5–40)

## 2019-04-26 LAB — TSH: TSH: 3.97 u[IU]/mL (ref 0.450–4.500)

## 2019-04-29 ENCOUNTER — Ambulatory Visit: Payer: 59 | Admitting: Cardiology

## 2019-04-30 ENCOUNTER — Telehealth: Payer: Self-pay

## 2019-04-30 DIAGNOSIS — I2510 Atherosclerotic heart disease of native coronary artery without angina pectoris: Secondary | ICD-10-CM

## 2019-04-30 DIAGNOSIS — E781 Pure hyperglyceridemia: Secondary | ICD-10-CM

## 2019-04-30 MED ORDER — FENOFIBRATE 200 MG PO CAPS: 200.0000 mg | ORAL_CAPSULE | Freq: Every day | ORAL | 4 refills | Status: DC

## 2019-04-30 NOTE — Telephone Encounter (Signed)
-----   Message from Jenean Lindau, MD sent at 04/26/2019  4:50 PM EDT ----- Fenofibrate high dose and liver lipid check in 6 weeks.  Diet low in carbohydrates and fatty foods.  Copy primary care Jenean Lindau, MD 04/26/2019 4:50 PM

## 2019-04-30 NOTE — Telephone Encounter (Signed)
Rx for 200mg  fenofibrate sent to [facility] and patient will come back in 6 wk for repeat LL. Copy sent to Dr. Andree Elk.

## 2019-05-21 ENCOUNTER — Ambulatory Visit: Payer: 59 | Admitting: Cardiology

## 2019-05-22 ENCOUNTER — Other Ambulatory Visit: Payer: Self-pay

## 2019-05-22 ENCOUNTER — Ambulatory Visit (INDEPENDENT_AMBULATORY_CARE_PROVIDER_SITE_OTHER)
Admission: RE | Admit: 2019-05-22 | Discharge: 2019-05-22 | Disposition: A | Discharge status: Home / Self Care | Payer: 59 | Source: Ambulatory Visit | Attending: Cardiology | Admitting: Cardiology

## 2019-05-22 DIAGNOSIS — I77810 Thoracic aortic ectasia: Secondary | ICD-10-CM

## 2019-05-22 DIAGNOSIS — I2510 Atherosclerotic heart disease of native coronary artery without angina pectoris: Secondary | ICD-10-CM | POA: Diagnosis not present

## 2019-05-22 DIAGNOSIS — I10 Essential (primary) hypertension: Secondary | ICD-10-CM | POA: Diagnosis not present

## 2019-05-22 MED ORDER — IOHEXOL 350 MG/ML SOLN
100.0000 mL | Freq: Once | INTRAVENOUS | Status: AC | PRN
  Administered 2019-05-22: 100 mL via INTRAVENOUS

## 2019-05-28 ENCOUNTER — Telehealth: Payer: Self-pay

## 2019-05-28 NOTE — Telephone Encounter (Signed)
Results relayed, no further questions. 

## 2019-05-28 NOTE — Telephone Encounter (Signed)
Left message for patient to return call for results, copy sent to Dr. Andree Elk.

## 2019-05-28 NOTE — Telephone Encounter (Signed)
-----   Message from Jenean Lindau, MD sent at 05/23/2019  8:20 AM EST ----- Annual imaging.  Please send copy to primary care.  The results of the study is unremarkable. Please inform patient. I will discuss in detail at next appointment. Cc  primary care/referring physician Jenean Lindau, MD 05/23/2019 8:19 AM

## 2019-11-01 ENCOUNTER — Encounter: Payer: Self-pay | Admitting: Cardiology

## 2019-11-01 ENCOUNTER — Other Ambulatory Visit: Payer: Self-pay

## 2019-11-01 ENCOUNTER — Ambulatory Visit: Payer: 59 | Admitting: Cardiology

## 2019-11-01 ENCOUNTER — Encounter (INDEPENDENT_AMBULATORY_CARE_PROVIDER_SITE_OTHER): Payer: Self-pay

## 2019-11-01 VITALS — BP 118/70 | HR 76 | Temp 98.6°F | Ht 67.0 in | Wt 207.6 lb

## 2019-11-01 DIAGNOSIS — I10 Essential (primary) hypertension: Secondary | ICD-10-CM | POA: Diagnosis not present

## 2019-11-01 DIAGNOSIS — I77810 Thoracic aortic ectasia: Secondary | ICD-10-CM | POA: Diagnosis not present

## 2019-11-01 DIAGNOSIS — I2510 Atherosclerotic heart disease of native coronary artery without angina pectoris: Secondary | ICD-10-CM | POA: Diagnosis not present

## 2019-11-01 NOTE — Patient Instructions (Signed)

## 2019-11-01 NOTE — Addendum Note (Signed)
Addended by: Eleonore Chiquito on: 11/01/2019 03:42 PM   Modules accepted: Orders

## 2019-11-01 NOTE — Progress Notes (Signed)
Cardiology Office Note:    Date:  11/01/2019   ID:  Jason Holland, DOB 27-Feb-1966, MRN [id number]  PCP:  Welford Roche, NP  Cardiologist:  Jenean Lindau, MD   Referring MD: Welford Roche, NP    ASSESSMENT:    1. Ascending aorta dilatation (HCC)   2. Atherosclerosis of native coronary artery of native heart without angina pectoris   3. Essential hypertension    PLAN:    In order of problems listed above:  1. Coronary artery disease: Secondary prevention stressed with the patient.  Importance of compliance with diet and medication stressed and he vocalized understanding. 2. Essential hypertension: Blood pressure is stable salt intake issues were discussed 3. Mixed dyslipidemia: Diet was emphasized.  Importance of regular exercise stressed and he vocalized understanding.  I would like to start Livalo on him but he had blood work done at his primary care provider's office and we will get a copy of it. 4. Ascending aortic dilatation: Stable at this time and we will recheck with a CT scan in 6 months 5. Patient will be seen in follow-up appointment in 6 months or earlier if the patient has any concerns    Medication Adjustments/Labs and Tests Ordered: Current medicines are reviewed at length with the patient today.  Concerns regarding medicines are outlined above.  No orders of the defined types were placed in this encounter.  No orders of the defined types were placed in this encounter.    No chief complaint on file.    History of Present Illness:    Jason Holland is a 54 y.o. male.  Patient has past medical history of atherosclerotic coronary vascular disease, ascending aortic dilatation dyslipidemia and essential hypertension.  He denies any problems at this time and takes care of activities of daily living.  He leads a sedentary lifestyle.  No chest pain orthopnea or PND.  He has gained weight.  At the time of my evaluation, the patient is alert awake oriented and in  no distress.  Past Medical History:  Diagnosis Date  . Ascending aorta dilatation (HCC) 04/24/2019  . Atherosclerotic coronary vascular disease 07/17/2018  . DOE (dyspnea on exertion) 06/13/2018  . Essential hypertension 06/13/2018    Past Surgical History:  Procedure Laterality Date  . ANKLE SURGERY    . CHOLECYSTECTOMY    . LEFT HEART CATH AND CORONARY ANGIOGRAPHY N/A 06/19/2018   Procedure: LEFT HEART CATH AND CORONARY ANGIOGRAPHY;  Surgeon: Burnell Blanks, MD;  Location: [facility];  Service: Cardiovascular;  Laterality: N/A;  . TONSILLECTOMY      Current Medications: Current Meds  Medication Sig  . ezetimibe (ZETIA) 10 MG tablet Take 10 mg by mouth daily.     Allergies:   Patient has no known allergies.   Social History   Socioeconomic History  . Marital status: Married    Spouse name: Not on file  . Number of children: Not on file  . Years of education: Not on file  . Highest education level: Not on file  Occupational History  . Not on file  Tobacco Use  . Smoking status: Never Smoker  . Smokeless tobacco: Never Used  Substance and Sexual Activity  . Alcohol use: Not on file  . Drug use: Not on file  . Sexual activity: Not on file  Other Topics Concern  . Not on file  Social History Narrative  . Not on file   Social Determinants of Health   Financial  Resource Strain:   . Difficulty of Paying Living Expenses:   Food Insecurity:   . Worried About Programme researcher, broadcasting/film/video in the Last Year:   . Barista in the Last Year:   Transportation Needs:   . Freight forwarder (Medical):   Marland Kitchen Lack of Transportation (Non-Medical):   Physical Activity:   . Days of Exercise per Week:   . Minutes of Exercise per Session:   Stress:   . Feeling of Stress :   Social Connections:   . Frequency of Communication with Friends and Family:   . Frequency of Social Gatherings with Friends and Family:   . Attends Religious Services:   . Active Member of  Clubs or Organizations:   . Attends Banker Meetings:   Marland Kitchen Marital Status:      Family History: The patient's family history includes Heart Problems in his father and mother; Hypertension in his father and mother.  ROS:   Please see the history of present illness.    All other systems reviewed and are negative.  EKGs/Labs/Other Studies Reviewed:    The following studies were reviewed today: Kathleene Hazel, MD (Primary)    Procedures  LEFT HEART CATH AND CORONARY ANGIOGRAPHY  Conclusion    Ost LM to Mid LM lesion is 10% stenosed.  Ost LAD to Prox LAD lesion is 20% stenosed.  The left ventricular systolic function is normal.  LV end diastolic pressure is normal.  The left ventricular ejection fraction is 55-65% by visual estimate.  There is no mitral valve regurgitation.   1. Mild non-obstructive plaque in the left main (10%) and proximal LAD (20%).  2. Normal LV systolic function  Recommendations: Medical management of mild CAD with ASA and statin.       Recent Labs: 04/25/2019: ALT 25; BUN 26; Creatinine, Ser 1.21; Hemoglobin 13.6; Platelets 284; Potassium 3.7; Sodium 140; TSH 3.970  Recent Lipid Panel    Component Value Date/Time   CHOL 148 04/25/2019 1009   TRIG 267 (H) 04/25/2019 1009   HDL 24 (L) 04/25/2019 1009   CHOLHDL 6.2 (H) 04/25/2019 1009   LDLCALC 80 04/25/2019 1009    Physical Exam:    VS:  There were no vitals taken for this visit.    Wt Readings from Last 3 Encounters:  04/24/19 197 lb 3.2 oz (89.4 kg)  07/17/18 197 lb (89.4 kg)  06/19/18 199 lb (90.3 kg)     GEN: Patient is in no acute distress HEENT: Normal NECK: No JVD; No carotid bruits LYMPHATICS: No lymphadenopathy CARDIAC: Hear sounds regular, 2/6 systolic murmur at the apex. RESPIRATORY:  Clear to auscultation without rales, wheezing or rhonchi  ABDOMEN: Soft, non-tender, non-distended MUSCULOSKELETAL:  No edema; No deformity  SKIN: Warm and  dry NEUROLOGIC:  Alert and oriented x 3 PSYCHIATRIC:  Normal affect   Signed, Garwin Brothers, MD  11/01/2019 11:33 AM    [facility]

## 2019-12-10 ENCOUNTER — Telehealth: Payer: Self-pay | Admitting: Cardiology

## 2019-12-10 NOTE — Telephone Encounter (Signed)
New Message    Jason Holland is calling and is wondering what Cholesterol medication the pt should be taking.  She says at his last visit they advised him to stop taking his cholesterol medication and that someone would let him know what he should start taking and no one has     Please call

## 2019-12-10 NOTE — Telephone Encounter (Signed)
Requested labs from PCP 

## 2020-05-15 ENCOUNTER — Encounter: Payer: Self-pay | Admitting: Cardiology

## 2020-05-15 ENCOUNTER — Other Ambulatory Visit: Payer: Self-pay

## 2020-05-15 ENCOUNTER — Ambulatory Visit: Payer: 59 | Admitting: Cardiology

## 2020-05-15 VITALS — BP 122/72 | HR 80 | Ht 66.5 in | Wt 205.2 lb

## 2020-05-15 DIAGNOSIS — E781 Pure hyperglyceridemia: Secondary | ICD-10-CM

## 2020-05-15 DIAGNOSIS — I10 Essential (primary) hypertension: Secondary | ICD-10-CM

## 2020-05-15 DIAGNOSIS — I2510 Atherosclerotic heart disease of native coronary artery without angina pectoris: Secondary | ICD-10-CM | POA: Diagnosis not present

## 2020-05-15 DIAGNOSIS — I712 Thoracic aortic aneurysm, without rupture, unspecified: Secondary | ICD-10-CM

## 2020-05-15 DIAGNOSIS — I77810 Thoracic aortic ectasia: Secondary | ICD-10-CM | POA: Diagnosis not present

## 2020-05-15 HISTORY — DX: Pure hyperglyceridemia: E78.1

## 2020-05-15 NOTE — Patient Instructions (Signed)
Medication Instructions:  No medication changes. *If you need a refill on your cardiac medications before your next appointment, please call your pharmacy*   Lab Work: Your physician recommends that you have labs done in the office today. Your test included  basic metabolic panel, complete blood count, TSH, liver function and lipids.  If you have labs (blood work) drawn today and your tests are completely normal, you will receive your results only by: Marland Kitchen MyChart Message (if you have MyChart) OR . A paper copy in the mail If you have any lab test that is abnormal or we need to change your treatment, we will call you to review the results.   Testing/Procedures: Non-Cardiac CT scanning, (CAT scanning), is a noninvasive, special x-ray that produces cross-sectional images of the body using x-rays and a computer. CT scans help physicians diagnose and treat medical conditions. For some CT exams, a contrast material is used to enhance visibility in the area of the body being studied. CT scans provide greater clarity and reveal more details than regular x-ray exams.    Follow-Up: At [facility], you and your health needs are our priority.  As part of our continuing mission to provide you with exceptional heart care, we have created designated Provider Care Teams.  These Care Teams include your primary Cardiologist (physician) and Advanced Practice Providers (APPs -  Physician Assistants and Nurse Practitioners) who all work together to provide you with the care you need, when you need it.  We recommend signing up for the patient portal called "MyChart".  Sign up information is provided on this After Visit Summary.  MyChart is used to connect with patients for Virtual Visits (Telemedicine).  Patients are able to view lab/test results, encounter notes, upcoming appointments, etc.  Non-urgent messages can be sent to your provider as well.   To learn more about what you can do with MyChart, go to  [email or web address].    Your next appointment:   6 month(s)  The format for your next appointment:   In Person  Provider:   Belva Crome, MD   Other Instructions NA

## 2020-05-15 NOTE — Progress Notes (Signed)
Cardiology Office Note:    Date:  05/15/2020   ID:  Jason Holland, DOB January 06, 1966, MRN [id number]  PCP:  Mikael Spray, NP  Cardiologist:  Garwin Brothers, MD   Referring MD: Mikael Spray, NP    ASSESSMENT:    1. Ascending aorta dilatation (HCC)   2. Atherosclerosis of native coronary artery of native heart without angina pectoris   3. Essential hypertension   4. Hypertriglyceridemia    PLAN:    In order of problems listed above:  1. Primary prevention stressed with the patient.  Importance of compliance with diet medication stressed any vocalized understanding 2. Essential hypertension: Blood pressure stable and diet was emphasized 3. Ascending aortic dilation: We will do a CT without contrast to assess this on annual basis.  Importance of diet and exercise stressed weight reduction was stressed and patient promises to do better. 4. Mixed dyslipidemia with hypertriglyceridemia: Diet was emphasized.  He is fasting and will do blood work today and advise him accordingly. 5. Obesity: Diet was emphasized.  Risks of obesity explained he plans to diet and exercise on a regular basis from now on. 6. Patient will be seen in follow-up appointment in 6 months or earlier if the patient has any concerns    Medication Adjustments/Labs and Tests Ordered: Current medicines are reviewed at length with the patient today.  Concerns regarding medicines are outlined above.  No orders of the defined types were placed in this encounter.  No orders of the defined types were placed in this encounter.    No chief complaint on file.    History of Present Illness:    Jason Holland is a 55 y.o. male.  Patient has past medical history of essential hypertension hypertriglyceridemia and ascending aortic dilatation.  He underwent CT scanning in November 2020 which revealed a dilated aorta and the ascending part of the aorta measured 4.2 cm.  Subsequently has had no problems no chest pain  orthopnea PND.  He leads a sedentary lifestyle and has been a little lax with his diet.  At the time of my evaluation, the patient is alert awake oriented and in no distress.  Past Medical History:  Diagnosis Date  . Ascending aorta dilatation (HCC) 04/24/2019  . Atherosclerotic coronary vascular disease 07/17/2018  . DOE (dyspnea on exertion) 06/13/2018  . Essential hypertension 06/13/2018    Past Surgical History:  Procedure Laterality Date  . ANKLE SURGERY    . CHOLECYSTECTOMY    . LEFT HEART CATH AND CORONARY ANGIOGRAPHY N/A 06/19/2018   Procedure: LEFT HEART CATH AND CORONARY ANGIOGRAPHY;  Surgeon: Kathleene Hazel, MD;  Location: [facility];  Service: Cardiovascular;  Laterality: N/A;  . TONSILLECTOMY      Current Medications: Current Meds  Medication Sig  . albuterol (PROVENTIL HFA;VENTOLIN HFA) 108 (90 Base) MCG/ACT inhaler Inhale 2 puffs into the lungs every 6 (six) hours as needed for wheezing or shortness of breath.   Marland Kitchen aspirin EC 81 MG tablet Take 1 tablet (81 mg total) by mouth daily.  Marland Kitchen ezetimibe (ZETIA) 10 MG tablet Take 10 mg by mouth daily.  . hydrochlorothiazide (HYDRODIURIL) 25 MG tablet Take 1 tablet by mouth daily.  Marland Kitchen lisinopril (PRINIVIL,ZESTRIL) 20 MG tablet Take 1 tablet by mouth daily.  . Omega-3 Fatty Acids (FISH OIL) 1000 MG CAPS Take 2 capsules (2,000 mg total) by mouth 2 (two) times daily.     Allergies:   Patient has no known allergies.   Social History  Socioeconomic History  . Marital status: Married    Spouse name: Not on file  . Number of children: Not on file  . Years of education: Not on file  . Highest education level: Not on file  Occupational History  . Not on file  Tobacco Use  . Smoking status: Never Smoker  . Smokeless tobacco: Never Used  Substance and Sexual Activity  . Alcohol use: Not on file  . Drug use: Not on file  . Sexual activity: Not on file  Other Topics Concern  . Not on file  Social History  Narrative  . Not on file   Social Determinants of Health   Financial Resource Strain:   . Difficulty of Paying Living Expenses: Not on file  Food Insecurity:   . Worried About Programme researcher, broadcasting/film/video in the Last Year: Not on file  . Ran Out of Food in the Last Year: Not on file  Transportation Needs:   . Lack of Transportation (Medical): Not on file  . Lack of Transportation (Non-Medical): Not on file  Physical Activity:   . Days of Exercise per Week: Not on file  . Minutes of Exercise per Session: Not on file  Stress:   . Feeling of Stress : Not on file  Social Connections:   . Frequency of Communication with Friends and Family: Not on file  . Frequency of Social Gatherings with Friends and Family: Not on file  . Attends Religious Services: Not on file  . Active Member of Clubs or Organizations: Not on file  . Attends Banker Meetings: Not on file  . Marital Status: Not on file     Family History: The patient's family history includes Heart Problems in his father and mother; Hypertension in his father and mother.  ROS:   Please see the history of present illness.    All other systems reviewed and are negative.  EKGs/Labs/Other Studies Reviewed:    The following studies were reviewed today: I discussed my findings with the patient at length.  His EKG is revealed sinus rhythm and nonspecific ST-T changes   Recent Labs: No results found for requested labs within last 8760 hours.  Recent Lipid Panel    Component Value Date/Time   CHOL 148 04/25/2019 1009   TRIG 267 (H) 04/25/2019 1009   HDL 24 (L) 04/25/2019 1009   CHOLHDL 6.2 (H) 04/25/2019 1009   LDLCALC 80 04/25/2019 1009    Physical Exam:    VS:  BP 122/72   Pulse 80   Ht 5' 6.5" (1.689 m)   Wt 205 lb 3.2 oz (93.1 kg)   SpO2 97%   BMI 32.62 kg/m     Wt Readings from Last 3 Encounters:  05/15/20 205 lb 3.2 oz (93.1 kg)  11/01/19 207 lb 9.6 oz (94.2 kg)  04/24/19 197 lb 3.2 oz (89.4 kg)      GEN: Patient is in no acute distress HEENT: Normal NECK: No JVD; No carotid bruits LYMPHATICS: No lymphadenopathy CARDIAC: Hear sounds regular, 2/6 systolic murmur at the apex. RESPIRATORY:  Clear to auscultation without rales, wheezing or rhonchi  ABDOMEN: Soft, non-tender, non-distended MUSCULOSKELETAL:  No edema; No deformity  SKIN: Warm and dry NEUROLOGIC:  Alert and oriented x 3 PSYCHIATRIC:  Normal affect   Signed, Garwin Brothers, MD  05/15/2020 11:38 AM    [facility]

## 2020-05-16 LAB — LIPID PANEL
Chol/HDL Ratio: 5.1 ratio — ABNORMAL HIGH (ref 0.0–5.0)
Cholesterol, Total: 157 mg/dL (ref 100–199)
HDL: 31 mg/dL — ABNORMAL LOW (ref >39)
LDL Chol Calc (NIH): 66 mg/dL (ref 0–99)
Triglycerides: 383 mg/dL — ABNORMAL HIGH (ref 0–149)
VLDL Cholesterol Cal: 60 mg/dL — ABNORMAL HIGH (ref 5–40)

## 2020-05-16 LAB — CBC WITH DIFFERENTIAL/PLATELET
Basophils Absolute: 0 10*3/uL (ref 0.0–0.2)
Basos: 0 %
EOS (ABSOLUTE): 0.1 10*3/uL (ref 0.0–0.4)
Eos: 2 %
Hematocrit: 42.8 % (ref 37.5–51.0)
Hemoglobin: 14.8 g/dL (ref 13.0–17.7)
Immature Grans (Abs): 0 10*3/uL (ref 0.0–0.1)
Immature Granulocytes: 0 %
Lymphocytes Absolute: 1.7 10*3/uL (ref 0.7–3.1)
Lymphs: 34 %
MCH: 31.2 pg (ref 26.6–33.0)
MCHC: 34.6 g/dL (ref 31.5–35.7)
MCV: 90 fL (ref 79–97)
Monocytes Absolute: 0.4 10*3/uL (ref 0.1–0.9)
Monocytes: 7 %
Neutrophils Absolute: 2.9 10*3/uL (ref 1.4–7.0)
Neutrophils: 57 %
Platelets: 223 10*3/uL (ref 150–450)
RBC: 4.74 x10E6/uL (ref 4.14–5.80)
RDW: 12.5 % (ref 11.6–15.4)
WBC: 5.1 10*3/uL (ref 3.4–10.8)

## 2020-05-16 LAB — TSH: TSH: 2.77 u[IU]/mL (ref 0.450–4.500)

## 2020-05-16 LAB — BASIC METABOLIC PANEL
BUN/Creatinine Ratio: 18 (ref 9–20)
BUN: 17 mg/dL (ref 6–24)
CO2: 23 mmol/L (ref 20–29)
Calcium: 9.1 mg/dL (ref 8.7–10.2)
Chloride: 100 mmol/L (ref 96–106)
Creatinine, Ser: 0.92 mg/dL (ref 0.76–1.27)
GFR calc Af Amer: 107 mL/min/{1.73_m2} (ref >59)
GFR calc non Af Amer: 93 mL/min/{1.73_m2} (ref >59)
Glucose: 96 mg/dL (ref 65–99)
Potassium: 3.9 mmol/L (ref 3.5–5.2)
Sodium: 138 mmol/L (ref 134–144)

## 2020-05-16 LAB — HEPATIC FUNCTION PANEL
ALT: 30 IU/L (ref 0–44)
AST: 24 IU/L (ref 0–40)
Albumin: 4.6 g/dL (ref 3.8–4.9)
Alkaline Phosphatase: 65 IU/L (ref 44–121)
Bilirubin Total: 0.5 mg/dL (ref 0.0–1.2)
Bilirubin, Direct: 0.13 mg/dL (ref 0.00–0.40)
Total Protein: 6.9 g/dL (ref 6.0–8.5)

## 2020-05-22 MED ORDER — FENOFIBRATE 145 MG PO TABS: 145.0000 mg | ORAL_TABLET | Freq: Every day | ORAL | 6 refills | Status: DC

## 2020-05-22 NOTE — Addendum Note (Signed)
Addended by: Eleonore Chiquito on: 05/22/2020 01:10 PM   Modules accepted: Orders

## 2020-06-01 ENCOUNTER — Encounter: Payer: Self-pay | Admitting: Cardiology

## 2020-06-05 ENCOUNTER — Telehealth: Payer: Self-pay | Admitting: Cardiology

## 2020-06-05 NOTE — Telephone Encounter (Signed)
Pt is aware that CT at [facility] was stable on his aorta dilation. Pt verbalized understanding and had no questions.

## 2020-06-05 NOTE — Telephone Encounter (Signed)
Jason Holland is calling stating he is returning a call from our office, but I was unable to find documentation to know who called. Please advise.

## 2020-07-23 LAB — HEPATIC FUNCTION PANEL
ALT: 20 IU/L (ref 0–44)
AST: 18 IU/L (ref 0–40)
Albumin: 4.4 g/dL (ref 3.8–4.9)
Alkaline Phosphatase: 44 IU/L (ref 44–121)
Bilirubin Total: 0.3 mg/dL (ref 0.0–1.2)
Bilirubin, Direct: 0.1 mg/dL (ref 0.00–0.40)
Total Protein: 6.9 g/dL (ref 6.0–8.5)

## 2020-07-23 LAB — LIPID PANEL
Chol/HDL Ratio: 5.6 ratio — ABNORMAL HIGH (ref 0.0–5.0)
Cholesterol, Total: 163 mg/dL (ref 100–199)
HDL: 29 mg/dL — ABNORMAL LOW (ref >39)
LDL Chol Calc (NIH): 84 mg/dL (ref 0–99)
Triglycerides: 302 mg/dL — ABNORMAL HIGH (ref 0–149)
VLDL Cholesterol Cal: 50 mg/dL — ABNORMAL HIGH (ref 5–40)

## 2020-07-24 ENCOUNTER — Telehealth: Payer: Self-pay

## 2020-07-24 DIAGNOSIS — I10 Essential (primary) hypertension: Secondary | ICD-10-CM

## 2020-07-24 DIAGNOSIS — E781 Pure hyperglyceridemia: Secondary | ICD-10-CM

## 2020-07-24 MED ORDER — OMEGA-3-ACID ETHYL ESTERS 1 G PO CAPS: 2.0000 g | ORAL_CAPSULE | Freq: Two times a day (BID) | ORAL | 5 refills | Status: DC

## 2020-07-24 NOTE — Telephone Encounter (Signed)
-----   Message from Garwin Brothers, MD sent at 07/24/2020  1:31 PM EST ----- Triglycerides are markedly elevated.  Please start Lovaza 2 g twice daily.  I think the patient is taking over-the-counter fish oil.  Copy to primary care.  He needs to come back in 2 months for follow-up Chem-7 liver lipid check.  Please tell him to keep diet low in carbohydrates and fried fatty foods. Garwin Brothers, MD 07/24/2020 1:31 PM

## 2020-07-24 NOTE — Telephone Encounter (Signed)
Pt is aware and agreeable to lab results and recommendation Per pt request will send RX to [facility] pharmacy Pt is agreeable to trying to diet and decrease fatty foods Pt is agreeable to repeat panels in 2 months

## 2020-10-23 ENCOUNTER — Encounter: Payer: Self-pay | Admitting: Cardiology

## 2020-10-23 ENCOUNTER — Other Ambulatory Visit: Payer: Self-pay

## 2020-10-23 ENCOUNTER — Ambulatory Visit: Payer: 59 | Admitting: Cardiology

## 2020-10-23 VITALS — BP 130/74 | HR 70 | Ht 67.0 in | Wt 210.4 lb

## 2020-10-23 DIAGNOSIS — I2510 Atherosclerotic heart disease of native coronary artery without angina pectoris: Secondary | ICD-10-CM | POA: Diagnosis not present

## 2020-10-23 DIAGNOSIS — I77810 Thoracic aortic ectasia: Secondary | ICD-10-CM

## 2020-10-23 DIAGNOSIS — R0609 Other forms of dyspnea: Secondary | ICD-10-CM

## 2020-10-23 DIAGNOSIS — I10 Essential (primary) hypertension: Secondary | ICD-10-CM | POA: Diagnosis not present

## 2020-10-23 DIAGNOSIS — R0600 Dyspnea, unspecified: Secondary | ICD-10-CM

## 2020-10-23 DIAGNOSIS — E781 Pure hyperglyceridemia: Secondary | ICD-10-CM

## 2020-10-23 LAB — BASIC METABOLIC PANEL
BUN/Creatinine Ratio: 20 (ref 9–20)
BUN: 22 mg/dL (ref 6–24)
CO2: 23 mmol/L (ref 20–29)
Calcium: 9.8 mg/dL (ref 8.7–10.2)
Chloride: 100 mmol/L (ref 96–106)
Creatinine, Ser: 1.11 mg/dL (ref 0.76–1.27)
Glucose: 110 mg/dL — ABNORMAL HIGH (ref 65–99)
Potassium: 3.8 mmol/L (ref 3.5–5.2)
Sodium: 138 mmol/L (ref 134–144)
eGFR: 78 mL/min/{1.73_m2} (ref >59)

## 2020-10-23 LAB — HEPATIC FUNCTION PANEL
ALT: 22 IU/L (ref 0–44)
AST: 17 IU/L (ref 0–40)
Albumin: 4.5 g/dL (ref 3.8–4.9)
Alkaline Phosphatase: 41 IU/L — ABNORMAL LOW (ref 44–121)
Bilirubin Total: 0.4 mg/dL (ref 0.0–1.2)
Bilirubin, Direct: 0.11 mg/dL (ref 0.00–0.40)
Total Protein: 7.1 g/dL (ref 6.0–8.5)

## 2020-10-23 LAB — CBC WITH DIFFERENTIAL/PLATELET
Basophils Absolute: 0 10*3/uL (ref 0.0–0.2)
Basos: 1 %
EOS (ABSOLUTE): 0.1 10*3/uL (ref 0.0–0.4)
Eos: 2 %
Hematocrit: 41.8 % (ref 37.5–51.0)
Hemoglobin: 14.3 g/dL (ref 13.0–17.7)
Immature Grans (Abs): 0 10*3/uL (ref 0.0–0.1)
Immature Granulocytes: 0 %
Lymphocytes Absolute: 1.8 10*3/uL (ref 0.7–3.1)
Lymphs: 35 %
MCH: 31 pg (ref 26.6–33.0)
MCHC: 34.2 g/dL (ref 31.5–35.7)
MCV: 91 fL (ref 79–97)
Monocytes Absolute: 0.4 10*3/uL (ref 0.1–0.9)
Monocytes: 9 %
Neutrophils Absolute: 2.7 10*3/uL (ref 1.4–7.0)
Neutrophils: 53 %
Platelets: 251 10*3/uL (ref 150–450)
RBC: 4.61 x10E6/uL (ref 4.14–5.80)
RDW: 12.1 % (ref 11.6–15.4)
WBC: 5 10*3/uL (ref 3.4–10.8)

## 2020-10-23 LAB — LIPID PANEL
Chol/HDL Ratio: 5.6 ratio — ABNORMAL HIGH (ref 0.0–5.0)
Cholesterol, Total: 168 mg/dL (ref 100–199)
HDL: 30 mg/dL — ABNORMAL LOW (ref >39)
LDL Chol Calc (NIH): 96 mg/dL (ref 0–99)
Triglycerides: 246 mg/dL — ABNORMAL HIGH (ref 0–149)
VLDL Cholesterol Cal: 42 mg/dL — ABNORMAL HIGH (ref 5–40)

## 2020-10-23 LAB — TSH: TSH: 4.08 u[IU]/mL (ref 0.450–4.500)

## 2020-10-23 NOTE — Patient Instructions (Signed)
Medication Instructions:  No medication changes. *If you need a refill on your cardiac medications before your next appointment, please call your pharmacy*   Lab Work: Your physician recommends that you have labs done in the office today. Your test included  basic metabolic panel, complete blood count, TSH, liver function and lipids.  If you have labs (blood work) drawn today and your tests are completely normal, you will receive your results only by: Marland Kitchen MyChart Message (if you have MyChart) OR . A paper copy in the mail If you have any lab test that is abnormal or we need to change your treatment, we will call you to review the results.   Testing/Procedures: Your physician has requested that you have an echocardiogram. Echocardiography is a painless test that uses sound waves to create images of your heart. It provides your doctor with information about the size and shape of your heart and how well your heart's chambers and valves are working. This procedure takes approximately one hour. There are no restrictions for this procedure.     Follow-Up: At [facility], you and your health needs are our priority.  As part of our continuing mission to provide you with exceptional heart care, we have created designated Provider Care Teams.  These Care Teams include your primary Cardiologist (physician) and Advanced Practice Providers (APPs -  Physician Assistants and Nurse Practitioners) who all work together to provide you with the care you need, when you need it.  We recommend signing up for the patient portal called "MyChart".  Sign up information is provided on this After Visit Summary.  MyChart is used to connect with patients for Virtual Visits (Telemedicine).  Patients are able to view lab/test results, encounter notes, upcoming appointments, etc.  Non-urgent messages can be sent to your provider as well.   To learn more about what you can do with MyChart, go to [email or web address].     Your next appointment:   6 month(s)  The format for your next appointment:   In Person  Provider:   Belva Crome, MD   Other Instructions  Echocardiogram An echocardiogram is a test that uses sound waves (ultrasound) to produce images of the heart. Images from an echocardiogram can provide important information about:  Heart size and shape.  The size and thickness and movement of your heart's walls.  Heart muscle function and strength.  Heart valve function or if you have stenosis. Stenosis is when the heart valves are too narrow.  If blood is flowing backward through the heart valves (regurgitation).  A tumor or infectious growth around the heart valves.  Areas of heart muscle that are not working well because of poor blood flow or injury from a heart attack.  Aneurysm detection. An aneurysm is a weak or damaged part of an artery wall. The wall bulges out from the normal force of blood pumping through the body. Tell a health care provider about:  Any allergies you have.  All medicines you are taking, including vitamins, herbs, eye drops, creams, and over-the-counter medicines.  Any blood disorders you have.  Any surgeries you have had.  Any medical conditions you have.  Whether you are pregnant or may be pregnant. What are the risks? Generally, this is a safe test. However, problems may occur, including an allergic reaction to dye (contrast) that may be used during the test. What happens before the test? No specific preparation is needed. You may eat and drink normally. What happens during the test?  You will take off your clothes from the waist up and put on a hospital gown.  Electrodes or electrocardiogram (ECG)patches may be placed on your chest. The electrodes or patches are then connected to a device that monitors your heart rate and rhythm.  You will lie down on a table for an ultrasound exam. A gel will be applied to your chest to help sound waves  pass through your skin.  A handheld device, called a transducer, will be pressed against your chest and moved over your heart. The transducer produces sound waves that travel to your heart and bounce back (or "echo" back) to the transducer. These sound waves will be captured in real-time and changed into images of your heart that can be viewed on a video monitor. The images will be recorded on a computer and reviewed by your health care provider.  You may be asked to change positions or hold your breath for a short time. This makes it easier to get different views or better views of your heart.  In some cases, you may receive contrast through an IV in one of your veins. This can improve the quality of the pictures from your heart. The procedure may vary among health care providers and hospitals.   What can I expect after the test? You may return to your normal, everyday life, including diet, activities, and medicines, unless your health care provider tells you not to do that. Follow these instructions at home:  It is up to you to get the results of your test. Ask your health care provider, or the department that is doing the test, when your results will be ready.  Keep all follow-up visits. This is important. Summary  An echocardiogram is a test that uses sound waves (ultrasound) to produce images of the heart.  Images from an echocardiogram can provide important information about the size and shape of your heart, heart muscle function, heart valve function, and other possible heart problems.  You do not need to do anything to prepare before this test. You may eat and drink normally.  After the echocardiogram is completed, you may return to your normal, everyday life, unless your health care provider tells you not to do that. This information is not intended to replace advice given to you by your health care provider. Make sure you discuss any questions you have with your health care  provider. Document Revised: 02/25/2020 Document Reviewed: 02/25/2020 Elsevier Patient Education  2021 [employer].

## 2020-10-23 NOTE — Progress Notes (Signed)
Cardiology Office Note:    Date:  10/23/2020   ID:  Jason Holland, DOB 10-Nov-1965, MRN [id number]  PCP:  Mikael Spray, NP  Cardiologist:  Garwin Brothers, MD   Referring MD: Mikael Spray, NP    ASSESSMENT:    1. Ascending aorta dilatation (HCC)   2. Atherosclerosis of native coronary artery of native heart without angina pectoris   3. Essential hypertension   4. Hypertriglyceridemia   5. DOE (dyspnea on exertion)    PLAN:    In order of problems listed above:  1. Coronary artery disease: Nonobstructive in nature by coronary angiography done in the recent past.  I discussed this with him at length.  Secondary prevention stressed.  Importance of compliance with diet medication stressed any vocalized understanding. 2. Essential hypertension: Blood pressure stable and diet was emphasized.  Lifestyle modification was urged.  I told him to walk at least half an hour a day 5 days a week. 3. Ascending aortic dilatation: Stable at this time.  CT scan report was reviewed with him at length and questions were answered to his satisfaction. 4. Mixed dyslipidemia: He will have blood work today including fasting lipids.  I will try to see if I can initiate him on a low-dose of Livalo to see if he can tolerate it.  I would also like to change him to Vascepa if we can be approved by his insurance.  I will await his blood work to make this decisions. 5. Diet and exercise was emphasized to lose weight and he promises to do better. 6. Patient will be seen in follow-up appointment in 6 months or earlier if the patient has any concerns    Medication Adjustments/Labs and Tests Ordered: Current medicines are reviewed at length with the patient today.  Concerns regarding medicines are outlined above.  Orders Placed This Encounter  Procedures  . Basic metabolic panel  . CBC with Differential/Platelet  . TSH  . Lipid panel  . Hepatic function panel  . ECHOCARDIOGRAM COMPLETE   No orders of the  defined types were placed in this encounter.    No chief complaint on file.    History of Present Illness:    Jason Holland is a 55 y.o. male.  Patient has past medical history of nonobstructive coronary artery disease, ascending aortic dilatation, essential hypertension and dyslipidemia.  He is overweight and leads a sedentary lifestyle.  He denies any chest pain orthopnea or PND.  At the time of my evaluation, the patient is alert awake oriented and in no distress.  Past Medical History:  Diagnosis Date  . Ascending aorta dilatation (HCC) 04/24/2019  . Atherosclerotic coronary vascular disease 07/17/2018  . DOE (dyspnea on exertion) 06/13/2018  . Essential hypertension 06/13/2018  . Hypertriglyceridemia 05/15/2020    Past Surgical History:  Procedure Laterality Date  . ANKLE SURGERY    . CHOLECYSTECTOMY    . LEFT HEART CATH AND CORONARY ANGIOGRAPHY N/A 06/19/2018   Procedure: LEFT HEART CATH AND CORONARY ANGIOGRAPHY;  Surgeon: Kathleene Hazel, MD;  Location: [facility];  Service: Cardiovascular;  Laterality: N/A;  . TONSILLECTOMY      Current Medications: Current Meds  Medication Sig  . albuterol (PROVENTIL HFA;VENTOLIN HFA) 108 (90 Base) MCG/ACT inhaler Inhale 2 puffs into the lungs every 6 (six) hours as needed for wheezing or shortness of breath.   Marland Kitchen aspirin EC 81 MG tablet Take 1 tablet (81 mg total) by mouth daily.  Marland Kitchen ezetimibe (ZETIA)  10 MG tablet Take 10 mg by mouth daily.  . hydrochlorothiazide (HYDRODIURIL) 25 MG tablet Take 1 tablet by mouth daily.  Marland Kitchen lisinopril (PRINIVIL,ZESTRIL) 20 MG tablet Take 1 tablet by mouth daily.  . nitroGLYCERIN (NITROSTAT) 0.4 MG SL tablet Place 0.4 mg under the tongue every 5 (five) minutes as needed for chest pain.  Marland Kitchen omega-3 acid ethyl esters (LOVAZA) 1 g capsule Take 2 capsules (2 g total) by mouth 2 (two) times daily.  . Omega-3 Fatty Acids (FISH OIL) 1000 MG CAPS Take 2 capsules (2,000 mg total) by mouth 2 (two)  times daily.     Allergies:   Patient has no known allergies.   Social History   Socioeconomic History  . Marital status: Married    Spouse name: Not on file  . Number of children: Not on file  . Years of education: Not on file  . Highest education level: Not on file  Occupational History  . Not on file  Tobacco Use  . Smoking status: Never Smoker  . Smokeless tobacco: Never Used  Substance and Sexual Activity  . Alcohol use: Not on file  . Drug use: Not on file  . Sexual activity: Not on file  Other Topics Concern  . Not on file  Social History Narrative  . Not on file   Social Determinants of Health   Financial Resource Strain: Not on file  Food Insecurity: Not on file  Transportation Needs: Not on file  Physical Activity: Not on file  Stress: Not on file  Social Connections: Not on file     Family History: The patient's family history includes Heart Problems in his father and mother; Hypertension in his father and mother.  ROS:   Please see the history of present illness.    All other systems reviewed and are negative.  EKGs/Labs/Other Studies Reviewed:    The following studies were reviewed today: EKG reveals sinus rhythm and nonspecific ST-T changes   Recent Labs: 05/15/2020: BUN 17; Creatinine, Ser 0.92; Hemoglobin 14.8; Platelets 223; Potassium 3.9; Sodium 138; TSH 2.770 07/23/2020: ALT 20  Recent Lipid Panel    Component Value Date/Time   CHOL 163 07/23/2020 1007   TRIG 302 (H) 07/23/2020 1007   HDL 29 (L) 07/23/2020 1007   CHOLHDL 5.6 (H) 07/23/2020 1007   LDLCALC 84 07/23/2020 1007    Physical Exam:    VS:  BP 130/74   Pulse 70   Ht 5\' 7"  (1.702 m)   Wt 210 lb 6.4 oz (95.4 kg)   SpO2 98%   BMI 32.95 kg/m     Wt Readings from Last 3 Encounters:  10/23/20 210 lb 6.4 oz (95.4 kg)  05/15/20 205 lb 3.2 oz (93.1 kg)  11/01/19 207 lb 9.6 oz (94.2 kg)     GEN: Patient is in no acute distress HEENT: Normal NECK: No JVD; No carotid  bruits LYMPHATICS: No lymphadenopathy CARDIAC: Hear sounds regular, 2/6 systolic murmur at the apex. RESPIRATORY:  Clear to auscultation without rales, wheezing or rhonchi  ABDOMEN: Soft, non-tender, non-distended MUSCULOSKELETAL:  No edema; No deformity  SKIN: Warm and dry NEUROLOGIC:  Alert and oriented x 3 PSYCHIATRIC:  Normal affect   Signed, 11/03/19, MD  10/23/2020 9:40 AM    [facility]

## 2020-10-28 MED ORDER — PITAVASTATIN CALCIUM 2 MG PO TABS: 2.0000 mg | ORAL_TABLET | ORAL | 3 refills | Status: DC

## 2020-10-28 NOTE — Addendum Note (Signed)
Addended by: Eleonore Chiquito on: 10/28/2020 10:44 AM   Modules accepted: Orders

## 2020-11-02 ENCOUNTER — Telehealth: Payer: Self-pay

## 2020-11-02 NOTE — Telephone Encounter (Signed)
Prior Authorization for Livalo 2 mg tablets with CoverMyMeds Key: [id number]

## 2020-11-04 ENCOUNTER — Telehealth: Payer: Self-pay | Admitting: Cardiology

## 2020-11-04 NOTE — Telephone Encounter (Signed)
Pt c/o medication issue:  1. Name of Medication: Pitavastatin Calcium 2 MG TABS  2. How are you currently taking this medication (dosage and times per day)?   3. Are you having a reaction (difficulty breathing--STAT)?   4. What is your medication issue? Pt was sent a card this morning by text  for this medicine, pt states with the card the med is still $246 for a 30 day supply. Pt states he was told script would be $25 with card

## 2020-11-04 NOTE — Telephone Encounter (Signed)
PA denied on CMM for Livalo due to patient not being on the required number of formulary alternatives. Patient informed he did take Atorvastatin but had to stop it due to myalgias. I texted him a copay card for the Livalo and he will call or come by if he has any more problems.

## 2020-11-23 ENCOUNTER — Other Ambulatory Visit: Payer: Self-pay

## 2020-11-23 ENCOUNTER — Ambulatory Visit (INDEPENDENT_AMBULATORY_CARE_PROVIDER_SITE_OTHER): Payer: 59

## 2020-11-23 DIAGNOSIS — I10 Essential (primary) hypertension: Secondary | ICD-10-CM | POA: Diagnosis not present

## 2020-11-23 DIAGNOSIS — R0609 Other forms of dyspnea: Secondary | ICD-10-CM

## 2020-11-23 DIAGNOSIS — R0600 Dyspnea, unspecified: Secondary | ICD-10-CM | POA: Diagnosis not present

## 2020-11-23 DIAGNOSIS — I77810 Thoracic aortic ectasia: Secondary | ICD-10-CM | POA: Diagnosis not present

## 2020-11-23 DIAGNOSIS — I2510 Atherosclerotic heart disease of native coronary artery without angina pectoris: Secondary | ICD-10-CM | POA: Diagnosis not present

## 2020-11-23 LAB — ECHOCARDIOGRAM COMPLETE
AR max vel: 2.49 cm2
AV Peak grad: 8 mmHg
Ao pk vel: 1.41 m/s
Area-P 1/2: 3.3 cm2
S' Lateral: 2.6 cm

## 2020-11-23 NOTE — Progress Notes (Signed)
Complete echocardiogram performed.  Jimmy Jamale Spangler RDCS, RVT  

## 2020-11-30 NOTE — Telephone Encounter (Signed)
Lillia Abed the drug rep for Livalo went to patient's pharmacy and gave them a new card to run through and showed them how and stated for the patient to try again and it should not be as expensive.  Pt notified and will attempt to get medicine again. Patient will call if he has any more problems.

## 2020-12-01 ENCOUNTER — Ambulatory Visit (INDEPENDENT_AMBULATORY_CARE_PROVIDER_SITE_OTHER): Payer: 59

## 2020-12-01 ENCOUNTER — Other Ambulatory Visit: Payer: Self-pay

## 2020-12-01 DIAGNOSIS — I77810 Thoracic aortic ectasia: Secondary | ICD-10-CM

## 2020-12-01 NOTE — Progress Notes (Signed)
Abdominal aortic duplex exam performed.  Jimmy Steffon Gladu RDCS, RVT 

## 2020-12-17 ENCOUNTER — Other Ambulatory Visit: Payer: Self-pay | Admitting: Cardiology

## 2020-12-17 NOTE — Telephone Encounter (Signed)
Pharmacy notified patient reported on 10/23/20, he is no longer on fenofibrate

## 2021-01-01 ENCOUNTER — Other Ambulatory Visit: Payer: Self-pay | Admitting: Cardiology

## 2021-01-04 NOTE — Telephone Encounter (Signed)
Patient no longer taking medication.

## 2021-01-14 ENCOUNTER — Other Ambulatory Visit: Payer: Self-pay | Admitting: Cardiology

## 2021-01-14 NOTE — Telephone Encounter (Signed)
Rx sent 

## 2021-03-08 ENCOUNTER — Other Ambulatory Visit: Payer: Self-pay | Admitting: Cardiology

## 2021-04-23 ENCOUNTER — Other Ambulatory Visit: Payer: Self-pay

## 2021-04-23 ENCOUNTER — Encounter: Payer: Self-pay | Admitting: Cardiology

## 2021-04-23 ENCOUNTER — Ambulatory Visit: Payer: 59 | Admitting: Cardiology

## 2021-04-23 VITALS — BP 136/82 | HR 71 | Ht 67.0 in | Wt 209.6 lb

## 2021-04-23 DIAGNOSIS — E781 Pure hyperglyceridemia: Secondary | ICD-10-CM

## 2021-04-23 DIAGNOSIS — I2510 Atherosclerotic heart disease of native coronary artery without angina pectoris: Secondary | ICD-10-CM | POA: Diagnosis not present

## 2021-04-23 DIAGNOSIS — I77810 Thoracic aortic ectasia: Secondary | ICD-10-CM | POA: Diagnosis not present

## 2021-04-23 DIAGNOSIS — I10 Essential (primary) hypertension: Secondary | ICD-10-CM

## 2021-04-23 DIAGNOSIS — I714 Abdominal aortic aneurysm, without rupture, unspecified: Secondary | ICD-10-CM

## 2021-04-23 DIAGNOSIS — Z1329 Encounter for screening for other suspected endocrine disorder: Secondary | ICD-10-CM

## 2021-04-23 HISTORY — DX: Abdominal aortic aneurysm, without rupture, unspecified: I71.40

## 2021-04-23 NOTE — Addendum Note (Signed)
Addended by: Eleonore Chiquito on: 04/23/2021 04:54 PM   Modules accepted: Orders

## 2021-04-23 NOTE — Progress Notes (Signed)
Cardiology Office Note:    Date:  04/23/2021   ID:  Jason Holland, DOB Sep 22, 1965, MRN [id number]  PCP:  Mikael Spray, NP  Cardiologist:  Garwin Brothers, MD   Referring MD: Mikael Spray, NP    ASSESSMENT:    1. Ascending aorta dilatation (HCC)   2. Atherosclerosis of native coronary artery of native heart without angina pectoris   3. Essential hypertension   4. Hypertriglyceridemia   5. Abdominal aortic aneurysm (AAA) without rupture, unspecified part    PLAN:    In order of problems listed above:  Coronary artery disease: Secondary prevention stressed with the patient.  Importance of compliance with diet medication stressed any vocalized understanding.  He was advised to have finality on a regular basis 5 days a week and he promises to do so. Essential hypertension: Blood pressure stable and diet was emphasized.  Lifestyle modification urged. Mixed dyslipidemia: Lipids were reviewed.  He cannot tolerate statins.  He will be getting them back for complete blood work and decide about line of therapy.  Other medications were not approved by his insurance company and this is a challenge for him. Ascending aortic and abdominal aortic aneurysm: Abdominal component stable and will continue to monitor.  He will have a CT scan of the chest to assess the ascending aortic aneurysm and further recommendations will be made based on those findings.  Questions were answered to satisfaction. Patient will be seen in follow-up appointment in 6 months or earlier if the patient has any concerns    Medication Adjustments/Labs and Tests Ordered: Current medicines are reviewed at length with the patient today.  Concerns regarding medicines are outlined above.  No orders of the defined types were placed in this encounter.  No orders of the defined types were placed in this encounter.    No chief complaint on file.    History of Present Illness:    Jason Holland is a 55 y.o. male.   Patient has past medical history of coronary artery disease, nonobstructive in nature, essential hypertension and dyslipidemia.  He denies any problems at this time and takes care of activities of daily living.  No chest pain orthopnea or PND.  He leads a sedentary lifestyle.  He is obese.  Past Medical History:  Diagnosis Date   Ascending aorta dilatation (HCC) 04/24/2019   Atherosclerotic coronary vascular disease 07/17/2018   DOE (dyspnea on exertion) 06/13/2018   Essential hypertension 06/13/2018   Hypertriglyceridemia 05/15/2020    Past Surgical History:  Procedure Laterality Date   ANKLE SURGERY     CHOLECYSTECTOMY     LEFT HEART CATH AND CORONARY ANGIOGRAPHY N/A 06/19/2018   Procedure: LEFT HEART CATH AND CORONARY ANGIOGRAPHY;  Surgeon: Kathleene Hazel, MD;  Location: [facility];  Service: Cardiovascular;  Laterality: N/A;   TONSILLECTOMY      Current Medications: Current Meds  Medication Sig   albuterol (PROVENTIL HFA;VENTOLIN HFA) 108 (90 Base) MCG/ACT inhaler Inhale 2 puffs into the lungs every 6 (six) hours as needed for wheezing or shortness of breath.    aspirin EC 81 MG tablet Take 1 tablet (81 mg total) by mouth daily.   hydrochlorothiazide (HYDRODIURIL) 25 MG tablet Take 1 tablet by mouth daily.   lisinopril (ZESTRIL) 20 MG tablet TAKE ONE TABLET BY MOUTH EVERY DAY   nitroGLYCERIN (NITROSTAT) 0.4 MG SL tablet Place 0.4 mg under the tongue every 5 (five) minutes as needed for chest pain.   omega-3 acid ethyl esters (  LOVAZA) 1 g capsule TAKE 2 CAPSULES BY MOUTH TWICE DAILY   Omega-3 Fatty Acids (FISH OIL) 1000 MG CAPS Take 2 capsules (2,000 mg total) by mouth 2 (two) times daily.     Allergies:   Atorvastatin   Social History   Socioeconomic History   Marital status: Married    Spouse name: Not on file   Number of children: Not on file   Years of education: Not on file   Highest education level: Not on file  Occupational History   Not on file   Tobacco Use   Smoking status: Never   Smokeless tobacco: Never  Substance and Sexual Activity   Alcohol use: Not on file   Drug use: Not on file   Sexual activity: Not on file  Other Topics Concern   Not on file  Social History Narrative   Not on file   Social Determinants of Health   Financial Resource Strain: Not on file  Food Insecurity: Not on file  Transportation Needs: Not on file  Physical Activity: Not on file  Stress: Not on file  Social Connections: Not on file     Family History: The patient's family history includes Heart Problems in his father and mother; Hypertension in his father and mother.  ROS:   Please see the history of present illness.    All other systems reviewed and are negative.  EKGs/Labs/Other Studies Reviewed:    The following studies were reviewed today: I discussed my findings with the patient at length including abdominal ultrasound.   Recent Labs: 10/23/2020: ALT 22; BUN 22; Creatinine, Ser 1.11; Hemoglobin 14.3; Platelets 251; Potassium 3.8; Sodium 138; TSH 4.080  Recent Lipid Panel    Component Value Date/Time   CHOL 168 10/23/2020 0946   TRIG 246 (H) 10/23/2020 0946   HDL 30 (L) 10/23/2020 0946   CHOLHDL 5.6 (H) 10/23/2020 0946   LDLCALC 96 10/23/2020 0946    Physical Exam:    VS:  BP 136/82   Pulse 71   Ht 5\' 7"  (1.702 m)   Wt 209 lb 9.6 oz (95.1 kg)   SpO2 97%   BMI 32.83 kg/m     Wt Readings from Last 3 Encounters:  04/23/21 209 lb 9.6 oz (95.1 kg)  10/23/20 210 lb 6.4 oz (95.4 kg)  05/15/20 205 lb 3.2 oz (93.1 kg)     GEN: Patient is in no acute distress HEENT: Normal NECK: No JVD; No carotid bruits LYMPHATICS: No lymphadenopathy CARDIAC: Hear sounds regular, 2/6 systolic murmur at the apex. RESPIRATORY:  Clear to auscultation without rales, wheezing or rhonchi  ABDOMEN: Soft, non-tender, non-distended MUSCULOSKELETAL:  No edema; No deformity  SKIN: Warm and dry NEUROLOGIC:  Alert and oriented x  3 PSYCHIATRIC:  Normal affect   Signed, 05/17/20, MD  04/23/2021 2:57 PM    [facility]

## 2021-04-23 NOTE — Patient Instructions (Addendum)
Medication Instructions:  No medication changes. *If you need a refill on your cardiac medications before your next appointment, please call your pharmacy*   Lab Work: Your physician recommends that you return for lab work in: the next few days You need to have labs done when you are fasting.  You can come Monday through Friday 8:30 am to 12:00 pm and 1:15 to 4:30. You do not need to make an appointment as the order has already been placed. The labs you are going to have done are BMET, CBC, TSH, LFT and Lipids.  If you have labs (blood work) drawn today and your tests are completely normal, you will receive your results only by: MyChart Message (if you have MyChart) OR A paper copy in the mail If you have any lab test that is abnormal or we need to change your treatment, we will call you to review the results.   Testing/Procedures: Non-Cardiac CT scanning, (CAT scanning), is a noninvasive, special x-ray that produces cross-sectional images of the body using x-rays and a computer. CT scans help physicians diagnose and treat medical conditions. For some CT exams, a contrast material is used to enhance visibility in the area of the body being studied. CT scans provide greater clarity and reveal more details than regular x-ray exams.    Follow-Up: At [facility], you and your health needs are our priority.  As part of our continuing mission to provide you with exceptional heart care, we have created designated Provider Care Teams.  These Care Teams include your primary Cardiologist (physician) and Advanced Practice Providers (APPs -  Physician Assistants and Nurse Practitioners) who all work together to provide you with the care you need, when you need it.  We recommend signing up for the patient portal called "MyChart".  Sign up information is provided on this After Visit Summary.  MyChart is used to connect with patients for Virtual Visits (Telemedicine).  Patients are able to view lab/test  results, encounter notes, upcoming appointments, etc.  Non-urgent messages can be sent to your provider as well.   To learn more about what you can do with MyChart, go to [email or web address].    Your next appointment:   6 month(s)  The format for your next appointment:   In Person  Provider:   Belva Crome, MD   Other Instructions NA

## 2021-05-03 ENCOUNTER — Telehealth (HOSPITAL_BASED_OUTPATIENT_CLINIC_OR_DEPARTMENT_OTHER): Payer: Self-pay

## 2021-06-07 ENCOUNTER — Ambulatory Visit (HOSPITAL_BASED_OUTPATIENT_CLINIC_OR_DEPARTMENT_OTHER)
Admission: RE | Admit: 2021-06-07 | Discharge: 2021-06-07 | Disposition: A | Discharge status: Home / Self Care | Payer: 59 | Source: Ambulatory Visit | Attending: Cardiology | Admitting: Cardiology

## 2021-06-07 ENCOUNTER — Other Ambulatory Visit: Payer: Self-pay

## 2021-06-07 DIAGNOSIS — I10 Essential (primary) hypertension: Secondary | ICD-10-CM | POA: Diagnosis present

## 2021-06-07 DIAGNOSIS — I2510 Atherosclerotic heart disease of native coronary artery without angina pectoris: Secondary | ICD-10-CM | POA: Diagnosis present

## 2021-06-07 DIAGNOSIS — I77810 Thoracic aortic ectasia: Secondary | ICD-10-CM | POA: Insufficient documentation

## 2021-06-07 DIAGNOSIS — I714 Abdominal aortic aneurysm, without rupture, unspecified: Secondary | ICD-10-CM | POA: Insufficient documentation

## 2021-06-11 ENCOUNTER — Other Ambulatory Visit: Payer: Self-pay | Admitting: Cardiology

## 2021-07-16 IMAGING — CT CT ANGIO CHEST
2 of 7 series · 18 of 46 positions shown · IV contrast (OMNIPAQUE 350)
Comparison: Chest CT May 28, 2018

CLINICAL DATA: Ascending thoracic aortic prominence

EXAM:
CT ANGIOGRAPHY CHEST WITH CONTRAST
TECHNIQUE: Multidetector CT imaging of the chest was performed using the
standard protocol during bolus administration of intravenous
contrast. Multiplanar CT image reconstructions and MIPs were
obtained to evaluate the vascular anatomy.
CONTRAST:  100mL OMNIPAQUE IOHEXOL 350 MG/ML SOLN

[Series 4: aorta 3.0 i31f 2 · axial · 0.79mm/px · z∈[-359,-56]mm · 15 of 111 slices shown]
[im 5/111  lung]
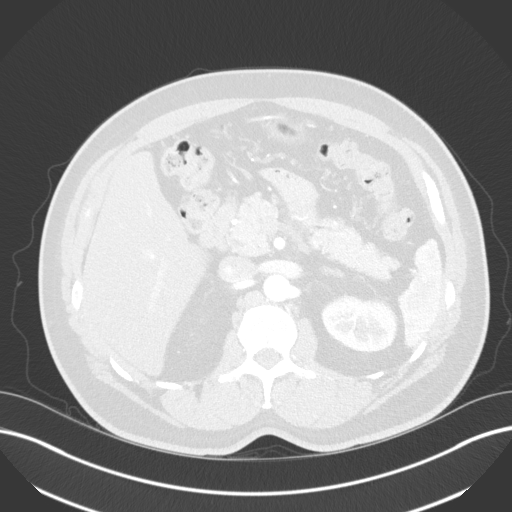
[im 13/111  soft-tissue]
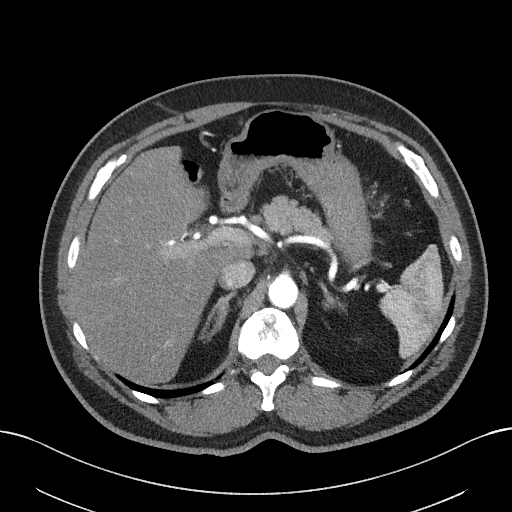
[im 21/111  lung]
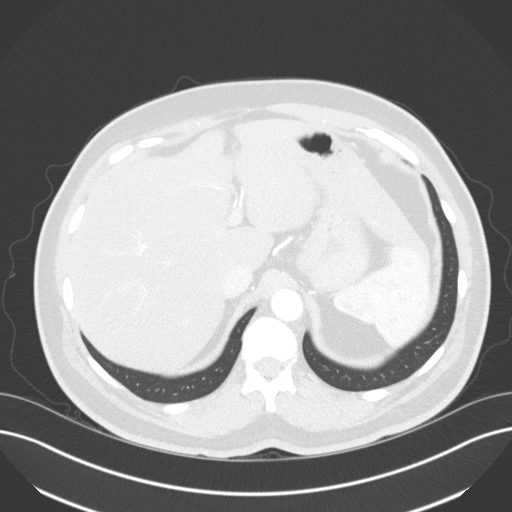
[im 29/111  soft-tissue]
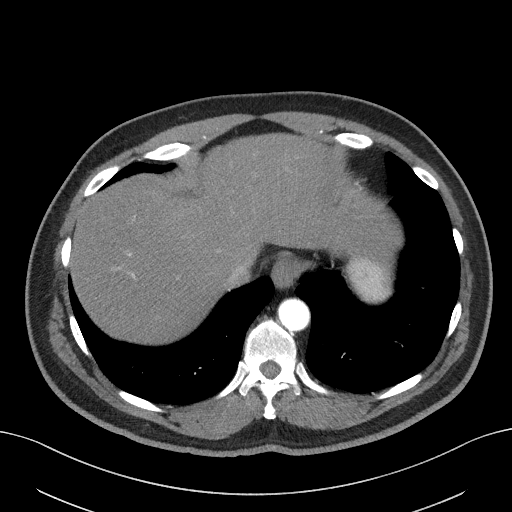
[im 33/111  lung]
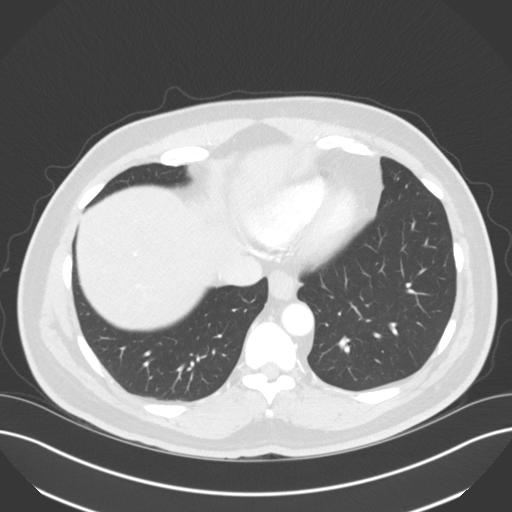
[im 41/111  soft-tissue]
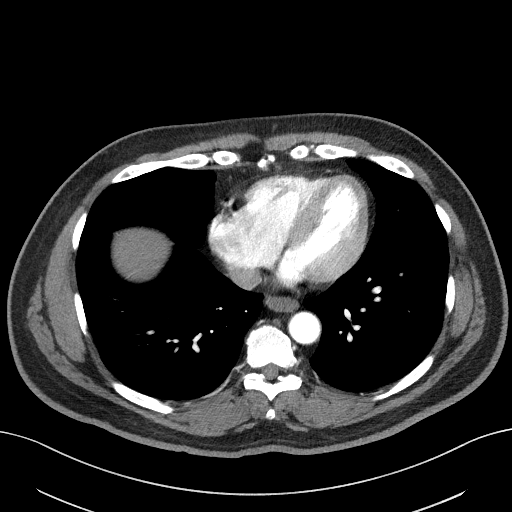
[im 49/111  lung]
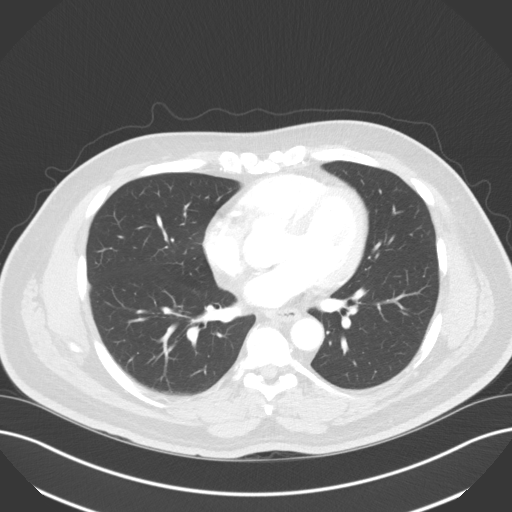
[im 58/111  soft-tissue]
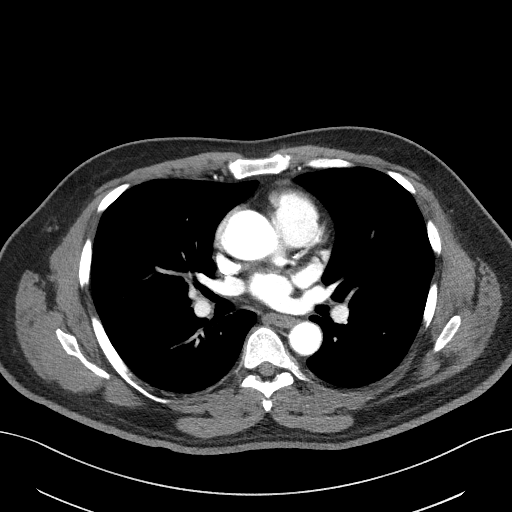
[im 62/111  lung]
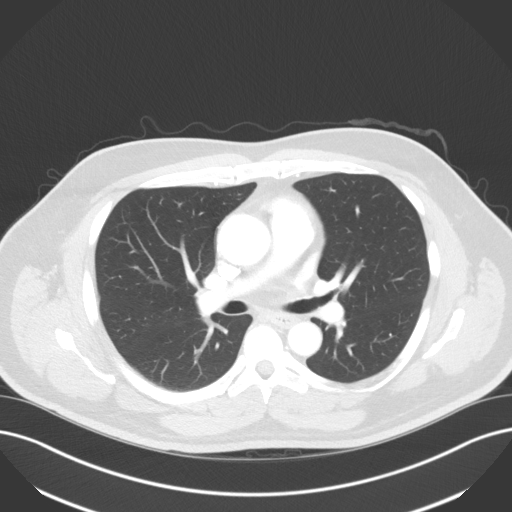
[im 70/111  soft-tissue]
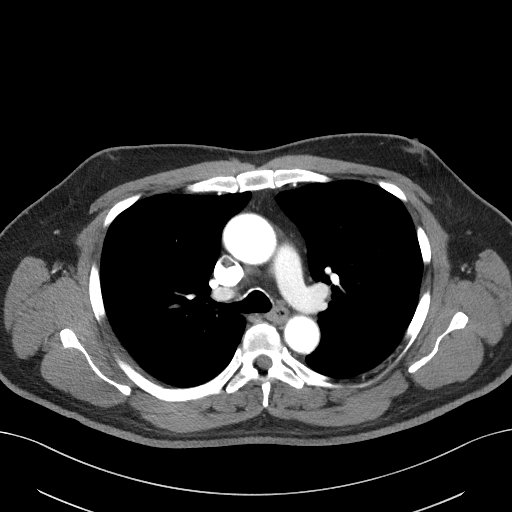
[im 78/111  lung]
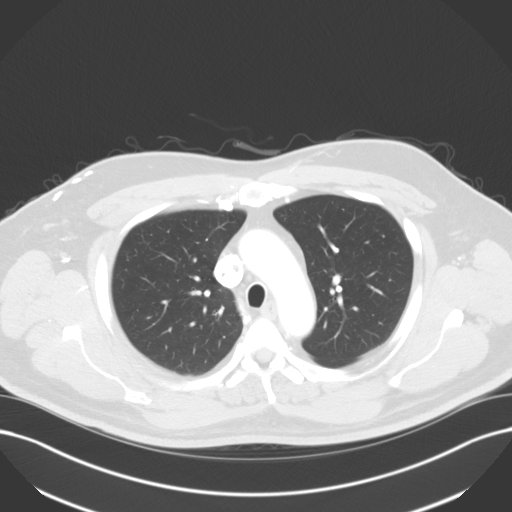
[im 82/111  soft-tissue]
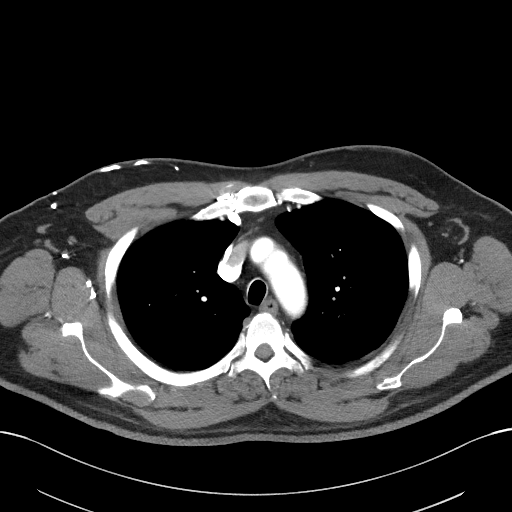
[im 90/111  lung]
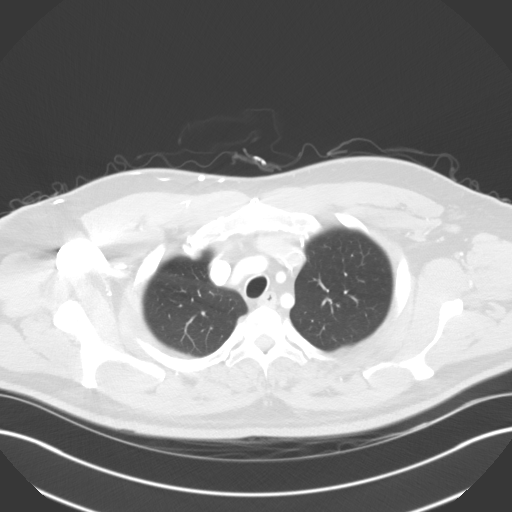
[im 98/111  soft-tissue]
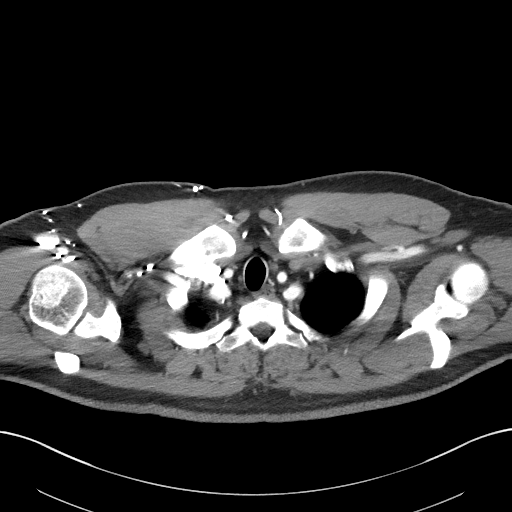
[im 106/111  lung]
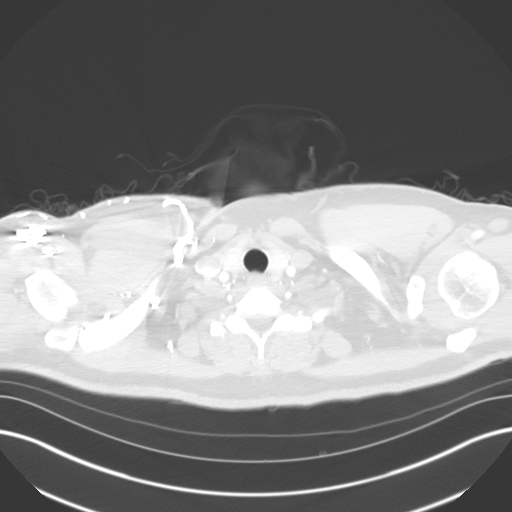

[Series 7: coronals · coronal · 0.65mm/px · 3 of 117 slices shown]
[im 30/117  soft-tissue]
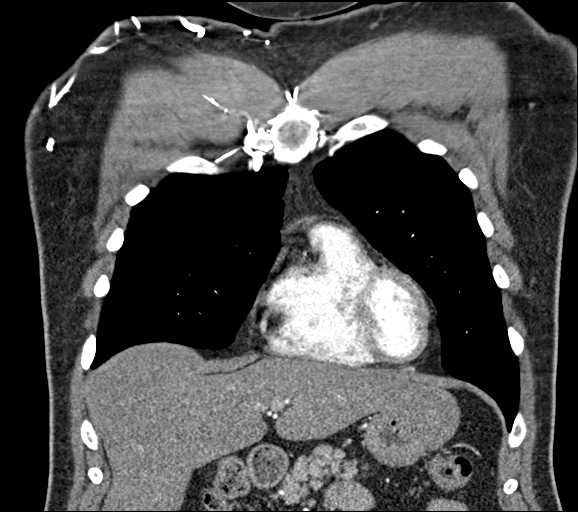
[im 59/117  soft-tissue]
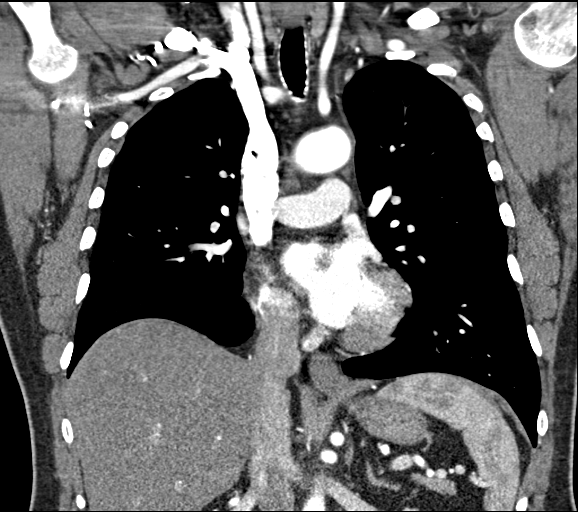
[im 88/117  soft-tissue]
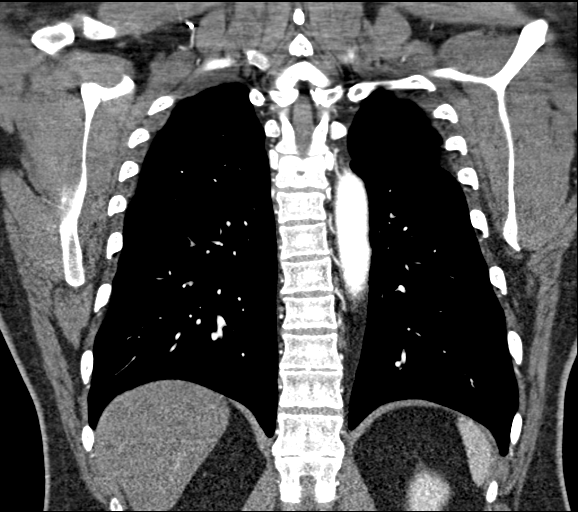

[18 of 46 positions shown; findings below may reference images not displayed]

FINDINGS: Cardiovascular: Ascending thoracic aortic diameter measures 4.2 x
4.1 cm. The measured diameter at the sinuses of Valsalva measures
4.0 cm cm. Measured transverse diameter at the sinotubular junction
measures 3.6 cm. Measured diameter at the aortic arch measures
cm. Measured transverse diameter of the descending thoracic aorta at
the level of the pulmonary outflow tract is 2.8 x 2.8 cm. No evident
thoracic aortic dissection. Visualized great vessels appear
unremarkable. No demonstrable pulmonary embolus. No pericardial
effusion or pericardial thickening. There are scattered foci
coronary artery calcification.

Mediastinum/Nodes: Visualized thyroid appears normal. There is no
evident thoracic adenopathy. No esophageal lesions evident.

Lungs/Pleura: Lungs are clear.  No pleural effusions evident.

Upper Abdomen: There is hepatic steatosis. Gallbladder absent.
Visualized upper abdominal structures otherwise appear unremarkable.

Musculoskeletal: No blastic or lytic bone lesions. No chest wall
lesions evident.

Review of the MIP images confirms the above findings.
IMPRESSION: 1. Ascending thoracic aortic diameter measures 4.2 x 4.1 cm,
essentially stable. No dissection evident. Recommend annual imaging
followup by CTA or MRA. This recommendation follows 9565
ACCF/AHA/AATS/ACR/ASA/SCA/MOTASIM/GADP/ESTER/ARIJANIT Guidelines for the
Diagnosis and Management of Patients with Thoracic Aortic Disease.
Circulation. 9565; 121: E266-e369. Aortic aneurysm NOS
([id number].Z). There are foci of coronary artery calcification.

2.  No demonstrable pulmonary embolus.

3.  Lungs clear.

4.  No evident thoracic adenopathy.

5.  Hepatic steatosis.

6.  Gallbladder absent.

## 2021-07-19 ENCOUNTER — Other Ambulatory Visit: Payer: Self-pay | Admitting: Cardiology

## 2021-07-19 DIAGNOSIS — E781 Pure hyperglyceridemia: Secondary | ICD-10-CM

## 2021-07-19 DIAGNOSIS — I10 Essential (primary) hypertension: Secondary | ICD-10-CM

## 2021-11-15 ENCOUNTER — Telehealth: Payer: Self-pay | Admitting: Cardiology

## 2021-11-15 NOTE — Telephone Encounter (Signed)
Advised that CT will be done yearly. ?

## 2021-11-15 NOTE — Telephone Encounter (Signed)
New Message: ? ? ? ? ?Patient wants to know if he is supposed to have another CT test? ?

## 2021-11-16 ENCOUNTER — Encounter: Payer: Self-pay | Admitting: Cardiology

## 2021-11-16 ENCOUNTER — Ambulatory Visit: Payer: 59 | Admitting: Cardiology

## 2021-11-16 VITALS — BP 116/66 | HR 82 | Ht 66.6 in | Wt 206.2 lb

## 2021-11-16 DIAGNOSIS — I2510 Atherosclerotic heart disease of native coronary artery without angina pectoris: Secondary | ICD-10-CM

## 2021-11-16 DIAGNOSIS — I10 Essential (primary) hypertension: Secondary | ICD-10-CM | POA: Diagnosis not present

## 2021-11-16 DIAGNOSIS — E781 Pure hyperglyceridemia: Secondary | ICD-10-CM | POA: Diagnosis not present

## 2021-11-16 DIAGNOSIS — I714 Abdominal aortic aneurysm, without rupture, unspecified: Secondary | ICD-10-CM

## 2021-11-16 DIAGNOSIS — I77810 Thoracic aortic ectasia: Secondary | ICD-10-CM | POA: Diagnosis not present

## 2021-11-16 MED ORDER — FENOFIBRATE 145 MG PO TABS: 145.0000 mg | ORAL_TABLET | Freq: Every day | ORAL | 3 refills | Status: AC

## 2021-11-16 NOTE — Patient Instructions (Addendum)
Medication Instructions:  ?Your physician has recommended you make the following change in your medication:  ? ?Start fenofibrate 145 mg daily. ? ?Stop fish oil ? ?*If you need a refill on your cardiac medications before your next appointment, please call your pharmacy* ? ? ?Lab Work: ?Your physician recommends that you return for lab work in: 2 months ?You need to have labs done when you are fasting.  You can come Monday through Friday 8:30 am to 12:00 pm and 1:15 to 4:30. You do not need to make an appointment as the order has already been placed. The labs you are going to have done are BMET, LFT and Lipids. ? ?If you have labs (blood work) drawn today and your tests are completely normal, you will receive your results only by: ?MyChart Message (if you have MyChart) OR ?A paper copy in the mail ?If you have any lab test that is abnormal or we need to change your treatment, we will call you to review the results. ? ? ?Testing/Procedures: ?Your physician has requested that you have an abdominal aorta duplex. During this test, an ultrasound is used to evaluate the aorta. Allow 30 minutes for this exam. Do not eat after midnight the day before and avoid carbonated beverages  ? ? ?Follow-Up: ?At [facility], you and your health needs are our priority.  As part of our continuing mission to provide you with exceptional heart care, we have created designated Provider Care Teams.  These Care Teams include your primary Cardiologist (physician) and Advanced Practice Providers (APPs -  Physician Assistants and Nurse Practitioners) who all work together to provide you with the care you need, when you need it. ? ?We recommend signing up for the patient portal called "MyChart".  Sign up information is provided on this After Visit Summary.  MyChart is used to connect with patients for Virtual Visits (Telemedicine).  Patients are able to view lab/test results, encounter notes, upcoming appointments, etc.  Non-urgent messages  can be sent to your provider as well.   ?To learn more about what you can do with MyChart, go to [email or web address].   ? ?Your next appointment:   ?9 month(s) ? ?The format for your next appointment:   ?In Person ? ?Provider:   ?Jyl Heinz, MD ? ? ?Other Instructions ?NA ? ?

## 2021-11-16 NOTE — Progress Notes (Signed)
?Cardiology Office Note:   ? ?Date:  11/16/2021  ? ?ID:  Jason Holland, DOB 03-30-1966, MRN [id number] ? ?PCP:  Gweneth Fritter, FNP  ?Cardiologist:  Jenean Lindau, MD  ? ?Referring MD: Welford Roche, NP  ? ? ?ASSESSMENT:   ? ?1. Atherosclerosis of native coronary artery of native heart without angina pectoris   ?2. Essential hypertension   ?3. Ascending aorta dilatation (HCC)   ?4. Hypertriglyceridemia   ?5. Abdominal aortic aneurysm (AAA) without rupture, unspecified part ([place])   ? ?PLAN:   ? ?In order of problems listed above: ? ?Coronary artery disease: Secondary prevention stressed with the patient importance of compliance with diet medication stressed any vocalized understanding.  He was advised to walk at least half an hour 5 days a week and he promises to do so. ?Essential hypertension: Blood pressure stable and diet was emphasized.  Lifestyle modification urged. ?Mixed dyslipidemia: Lipids were reviewed.  His triglycerides are markedly elevated.  I told him to switch from regular fish oil to Vascepa 2 g twice daily.  He will be back in 2 months for complete blood work including vitamin D and A1c per his request. ?Obesity: Weight reduction stressed risks of obesity explained and he promises to comply. ?Patient will be seen in follow-up appointment in 9 months or earlier if the patient has any concerns ? ? ? ?Medication Adjustments/Labs and Tests Ordered: ?Current medicines are reviewed at length with the patient today.  Concerns regarding medicines are outlined above.  ?No orders of the defined types were placed in this encounter. ? ?No orders of the defined types were placed in this encounter. ? ? ? ?No chief complaint on file. ?  ? ?History of Present Illness:   ? ?Jason Holland is a 56 y.o. male.  Patient has past medical history of coronary artery disease documented by calcifications on CT scan, ascending aortic dilatation, abdominal aortic aneurysm and hypertriglyceridemia.  He has history of  controlled hypertension.  He denies any problems at this time and takes care of activities of daily living.  No chest pain orthopnea or PND.  He is taking over-the-counter fish oil.  At the time of my evaluation, the patient is alert awake oriented and in no distress. ? ?Past Medical History:  ?Diagnosis Date  ? AAA (abdominal aortic aneurysm) without rupture ([place]) 04/23/2021  ? Ascending aorta dilatation (HCC) 04/24/2019  ? Atherosclerotic coronary vascular disease 07/17/2018  ? DOE (dyspnea on exertion) 06/13/2018  ? Essential hypertension 06/13/2018  ? Hypertriglyceridemia 05/15/2020  ? ? ?Past Surgical History:  ?Procedure Laterality Date  ? ANKLE SURGERY    ? CHOLECYSTECTOMY    ? LEFT HEART CATH AND CORONARY ANGIOGRAPHY N/A 06/19/2018  ? Procedure: LEFT HEART CATH AND CORONARY ANGIOGRAPHY;  Surgeon: Burnell Blanks, MD;  Location: [facility];  Service: Cardiovascular;  Laterality: N/A;  ? TONSILLECTOMY    ? ? ?Current Medications: ?Current Meds  ?Medication Sig  ? albuterol (PROVENTIL HFA;VENTOLIN HFA) 108 (90 Base) MCG/ACT inhaler Inhale 2 puffs into the lungs every 6 (six) hours as needed for wheezing or shortness of breath.   ? aspirin EC 81 MG tablet Take 1 tablet (81 mg total) by mouth daily.  ? hydrochlorothiazide (HYDRODIURIL) 25 MG tablet Take 1 tablet by mouth daily.  ? lisinopril (ZESTRIL) 20 MG tablet Take 1 tablet (20 mg total) by mouth daily.  ? nitroGLYCERIN (NITROSTAT) 0.4 MG SL tablet Place 0.4 mg under the tongue every 5 (five) minutes  as needed for chest pain.  ? Omega-3 Fatty Acids (FISH OIL) 1000 MG CAPS Take 2 capsules (2,000 mg total) by mouth 2 (two) times daily.  ?  ? ?Allergies:   Atorvastatin and Lovaza [omega-3-acid ethyl esters]  ? ?Social History  ? ?Socioeconomic History  ? Marital status: Married  ?  Spouse name: Not on file  ? Number of children: Not on file  ? Years of education: Not on file  ? Highest education level: Not on file  ?Occupational History  ? Not on  file  ?Tobacco Use  ? Smoking status: Never  ? Smokeless tobacco: Never  ?Substance and Sexual Activity  ? Alcohol use: Not on file  ? Drug use: Not on file  ? Sexual activity: Not on file  ?Other Topics Concern  ? Not on file  ?Social History Narrative  ? Not on file  ? ?Social Determinants of Health  ? ?Financial Resource Strain: Not on file  ?Food Insecurity: Not on file  ?Transportation Needs: Not on file  ?Physical Activity: Not on file  ?Stress: Not on file  ?Social Connections: Not on file  ?  ? ?Family History: ?The patient's family history includes Heart Problems in his father and mother; Hypertension in his father and mother. ? ?ROS:   ?Please see the history of present illness.    ?All other systems reviewed and are negative. ? ?EKGs/Labs/Other Studies Reviewed:   ? ?The following studies were reviewed today: ?I discussed my findings with the patient at length. ? ? ?Recent Labs: ?No results found for requested labs within last 8760 hours.  ?Recent Lipid Panel ?   ?Component Value Date/Time  ? CHOL 168 10/23/2020 0946  ? TRIG 246 (H) 10/23/2020 0946  ? HDL 30 (L) 10/23/2020 0946  ? CHOLHDL 5.6 (H) 10/23/2020 0946  ? [place] 96 10/23/2020 0946  ? ? ?Physical Exam:   ? ?VS:  BP 116/66   Pulse 82   Ht 5' 6.6" (1.692 m)   Wt 206 lb 3.2 oz (93.5 kg)   SpO2 97%   BMI 32.68 kg/m?    ? ?Wt Readings from Last 3 Encounters:  ?11/16/21 206 lb 3.2 oz (93.5 kg)  ?04/23/21 209 lb 9.6 oz (95.1 kg)  ?10/23/20 210 lb 6.4 oz (95.4 kg)  ?  ? ?GEN: Patient is in no acute distress ?HEENT: Normal ?NECK: No JVD; No carotid bruits ?LYMPHATICS: No lymphadenopathy ?CARDIAC: Hear sounds regular, 2/6 systolic murmur at the apex. ?RESPIRATORY:  Clear to auscultation without rales, wheezing or rhonchi  ?ABDOMEN: Soft, non-tender, non-distended ?MUSCULOSKELETAL:  No edema; No deformity  ?SKIN: Warm and dry ?NEUROLOGIC:  Alert and oriented x 3 ?PSYCHIATRIC:  Normal affect  ? ?Signed, ?Jenean Lindau, MD  ?11/16/2021 10:20 AM     ?[place]  ?

## 2021-11-29 ENCOUNTER — Ambulatory Visit (INDEPENDENT_AMBULATORY_CARE_PROVIDER_SITE_OTHER): Payer: 59

## 2021-11-29 DIAGNOSIS — I2510 Atherosclerotic heart disease of native coronary artery without angina pectoris: Secondary | ICD-10-CM | POA: Diagnosis not present

## 2021-11-29 DIAGNOSIS — I77810 Thoracic aortic ectasia: Secondary | ICD-10-CM | POA: Diagnosis not present

## 2021-11-29 DIAGNOSIS — I714 Abdominal aortic aneurysm, without rupture, unspecified: Secondary | ICD-10-CM

## 2022-02-18 DIAGNOSIS — R5383 Other fatigue: Secondary | ICD-10-CM | POA: Insufficient documentation

## 2022-02-18 DIAGNOSIS — R7303 Prediabetes: Secondary | ICD-10-CM | POA: Insufficient documentation

## 2022-02-18 HISTORY — DX: Prediabetes: R73.03

## 2022-02-18 HISTORY — DX: Other fatigue: R53.83

## 2022-08-19 ENCOUNTER — Ambulatory Visit: Payer: 59 | Attending: Cardiology | Admitting: Cardiology

## 2022-08-19 ENCOUNTER — Encounter: Payer: Self-pay | Admitting: Cardiology

## 2022-08-19 VITALS — BP 130/72 | HR 80 | Ht 67.0 in | Wt 210.0 lb

## 2022-08-19 DIAGNOSIS — E66811 Obesity, class 1: Secondary | ICD-10-CM

## 2022-08-19 DIAGNOSIS — E669 Obesity, unspecified: Secondary | ICD-10-CM | POA: Insufficient documentation

## 2022-08-19 DIAGNOSIS — E781 Pure hyperglyceridemia: Secondary | ICD-10-CM | POA: Diagnosis not present

## 2022-08-19 DIAGNOSIS — I10 Essential (primary) hypertension: Secondary | ICD-10-CM

## 2022-08-19 DIAGNOSIS — I2510 Atherosclerotic heart disease of native coronary artery without angina pectoris: Secondary | ICD-10-CM | POA: Diagnosis not present

## 2022-08-19 HISTORY — DX: Obesity, class 1: E66.811

## 2022-08-19 NOTE — Patient Instructions (Signed)

## 2022-08-19 NOTE — Progress Notes (Addendum)
Cardiology Office Note:    Date:  08/19/2022   ID:  [name], DOB 09/20/1965, MRN [id number]  PCP:  Gweneth Fritter, FNP  Cardiologist:  Jenean Lindau, MD   Referring MD: Gweneth Fritter, FNP    ASSESSMENT:    1. Essential hypertension   2. Atherosclerosis of native coronary artery of native heart without angina pectoris   3. Hypertriglyceridemia   4. Obesity (BMI 30.0-34.9)    PLAN:    In order of problems listed above:  Coronary artery disease: Nonobstructive in nature.  Secondary prevention stressed with the patient.  Importance of compliance with diet medication stressed and he vocalized understanding.  He was advised to walk at least half an hour a day 5 days a week and he promises to do so. Essential hypertension: Blood pressure stable and diet was emphasized.  Lifestyle modification urged. Abdominal aortic aneurysm: Stable.  We will continue to monitor.  Lifestyle modification urged. Mixed dyslipidemia: On lipid-lowering medications followed by primary care.  He will get blood work in 2 weeks and get to me.  He cannot tolerate statins.  I will see whether he is a candidate for medications like Nexletol if his lipids are elevated. Obesity: Weight reduction stressed.  Diet emphasized.  He promises to do better.  Risks of obesity explained. Patient will be seen in follow-up appointment in 6 months or earlier if the patient has any concerns Visit chaperoned by our staff Alfredia Client CMA    Medication Adjustments/Labs and Tests Ordered: Current medicines are reviewed at length with the patient today.  Concerns regarding medicines are outlined above.  Orders Placed This Encounter  Procedures   EKG 12-Lead   No orders of the defined types were placed in this encounter.    No chief complaint on file.    History of Present Illness:    Jason Holland is a 57 y.o. male.  Patient has past medical history of abdominal aortic aneurysm, atherosclerotic nonobstructive  coronary artery disease, essential hypertension and dyslipidemia.  He denies any problems at this time and takes care of activities of daily living.  No chest pain orthopnea or PND.  At the time of my evaluation, the patient is alert awake oriented and in no distress.  Past Medical History:  Diagnosis Date   AAA (abdominal aortic aneurysm) without rupture ([place]) 04/23/2021   Ascending aorta dilatation ([place]) 04/24/2019   Atherosclerotic coronary vascular disease 07/17/2018   DOE (dyspnea on exertion) 06/13/2018   Essential hypertension 06/13/2018   Hypertriglyceridemia 05/15/2020   Other fatigue 02/18/2022   Prediabetes 02/18/2022   Primary hypertension 06/13/2018    Past Surgical History:  Procedure Laterality Date   ANKLE SURGERY     CHOLECYSTECTOMY     LEFT HEART CATH AND CORONARY ANGIOGRAPHY N/A 06/19/2018   Procedure: LEFT HEART CATH AND CORONARY ANGIOGRAPHY;  Surgeon: Burnell Blanks, MD;  Location: [facility];  Service: Cardiovascular;  Laterality: N/A;   TONSILLECTOMY      Current Medications: Current Meds  Medication Sig   albuterol (PROVENTIL HFA;VENTOLIN HFA) 108 (90 Base) MCG/ACT inhaler Inhale 2 puffs into the lungs every 6 (six) hours as needed for wheezing or shortness of breath.    amoxicillin-clavulanate (AUGMENTIN) 875-125 MG tablet Take 1 tablet by mouth 2 (two) times daily.   aspirin EC 81 MG tablet Take 1 tablet (81 mg total) by mouth daily.   fenofibrate (TRICOR) 145 MG tablet Take 1 tablet (145 mg total) by mouth  daily.   hydrochlorothiazide (HYDRODIURIL) 25 MG tablet Take 1 tablet by mouth daily.   lisinopril (ZESTRIL) 20 MG tablet Take 1 tablet (20 mg total) by mouth daily.   nitroGLYCERIN (NITROSTAT) 0.4 MG SL tablet Place 0.4 mg under the tongue every 5 (five) minutes as needed for chest pain.   Omega-3 Fatty Acids (FISH OIL) 1000 MG CAPS Take 2 capsules (2,000 mg total) by mouth 2 (two) times daily.     Allergies:   Atorvastatin and Lovaza  [omega-3-acid ethyl esters]   Social History   Socioeconomic History   Marital status: Married    Spouse name: Not on file   Number of children: Not on file   Years of education: Not on file   Highest education level: Not on file  Occupational History   Not on file  Tobacco Use   Smoking status: Never   Smokeless tobacco: Never  Substance and Sexual Activity   Alcohol use: Not on file   Drug use: Not on file   Sexual activity: Not on file  Other Topics Concern   Not on file  Social History Narrative   Not on file   Social Determinants of Health   Financial Resource Strain: Not on file  Food Insecurity: Not on file  Transportation Needs: Not on file  Physical Activity: Not on file  Stress: Not on file  Social Connections: Not on file     Family History: The patient's family history includes Heart Problems in his father and mother; Hypertension in his father and mother.  ROS:   Please see the history of present illness.    All other systems reviewed and are negative.  EKGs/Labs/Other Studies Reviewed:    The following studies were reviewed today: EKG reveals sinus rhythm and inferior wall myocardial infarction and nonspecific ST-T changes   Recent Labs: No results found for requested labs within last 365 days.  Recent Lipid Panel    Component Value Date/Time   CHOL 168 10/23/2020 0946   TRIG 246 (H) 10/23/2020 0946   HDL 30 (L) 10/23/2020 0946   CHOLHDL 5.6 (H) 10/23/2020 0946   LDLCALC 96 10/23/2020 0946    Physical Exam:    VS:  BP 130/72   Pulse 80   Ht 5\' 7"  (1.702 m)   Wt 210 lb (95.3 kg)   SpO2 96%   BMI 32.89 kg/m     Wt Readings from Last 3 Encounters:  08/19/22 210 lb (95.3 kg)  11/16/21 206 lb 3.2 oz (93.5 kg)  04/23/21 209 lb 9.6 oz (95.1 kg)     GEN: Patient is in no acute distress HEENT: Normal NECK: No JVD; No carotid bruits LYMPHATICS: No lymphadenopathy CARDIAC: Hear sounds regular, 2/6 systolic murmur at the  apex. RESPIRATORY:  Clear to auscultation without rales, wheezing or rhonchi  ABDOMEN: Soft, non-tender, non-distended MUSCULOSKELETAL:  No edema; No deformity  SKIN: Warm and dry NEUROLOGIC:  Alert and oriented x 3 PSYCHIATRIC:  Normal affect   Signed, Jenean Lindau, MD  08/19/2022 11:04 AM    [place]

## 2023-03-13 DIAGNOSIS — K219 Gastro-esophageal reflux disease without esophagitis: Secondary | ICD-10-CM | POA: Insufficient documentation

## 2023-03-13 DIAGNOSIS — Z789 Other specified health status: Secondary | ICD-10-CM

## 2023-03-13 HISTORY — DX: Gastro-esophageal reflux disease without esophagitis: K21.9

## 2023-03-13 HISTORY — DX: Other specified health status: Z78.9

## 2023-06-22 ENCOUNTER — Ambulatory Visit: Payer: 59 | Attending: Cardiology | Admitting: Cardiology

## 2023-06-22 ENCOUNTER — Encounter: Payer: Self-pay | Admitting: Cardiology

## 2023-06-22 VITALS — BP 126/78 | HR 82 | Ht 67.0 in | Wt 203.8 lb

## 2023-06-22 DIAGNOSIS — E781 Pure hyperglyceridemia: Secondary | ICD-10-CM

## 2023-06-22 DIAGNOSIS — I77810 Thoracic aortic ectasia: Secondary | ICD-10-CM

## 2023-06-22 DIAGNOSIS — Z789 Other specified health status: Secondary | ICD-10-CM

## 2023-06-22 DIAGNOSIS — E66811 Obesity, class 1: Secondary | ICD-10-CM | POA: Diagnosis not present

## 2023-06-22 DIAGNOSIS — I7143 Infrarenal abdominal aortic aneurysm, without rupture: Secondary | ICD-10-CM | POA: Diagnosis not present

## 2023-06-22 DIAGNOSIS — I2510 Atherosclerotic heart disease of native coronary artery without angina pectoris: Secondary | ICD-10-CM | POA: Diagnosis not present

## 2023-06-22 DIAGNOSIS — I10 Essential (primary) hypertension: Secondary | ICD-10-CM

## 2023-06-22 NOTE — Progress Notes (Signed)
Cardiology Office Note:    Date:  06/22/2023   ID:  Jason Holland, DOB 26-May-1966, MRN [id number]  PCP:  Audie Pinto, FNP  Cardiologist:  Garwin Brothers, MD   Referring MD: Audie Pinto, FNP    ASSESSMENT:    1. Hypertriglyceridemia   2. Infrarenal abdominal aortic aneurysm (AAA) without rupture (HCC)   3. Atherosclerosis of native coronary artery of native heart without angina pectoris   4. Obesity (BMI 30.0-34.9)   5. Statin intolerance   6. Essential hypertension   7. Ascending aorta dilatation (HCC)    PLAN:    In order of problems listed above:  Coronary atherosclerosis: Secondary prevention stressed with the patient.  Importance of compliance with diet medication stressed any vocalized understanding.  He was advised to walk 30 minutes a day 5 days a week and he promises to do so. Essential hypertension: Blood pressure stable and diet was emphasized. Mixed dyslipidemia: On lipid-lowering medications.  Lipids are not at goal.  He will be back in 3 months for liver lipid check. Aortic aneurysm: Stable CT scan report from [facility] was reviewed.  I discussed the report with him at length. Obesity: Weight reduction stressed diet emphasized and he promises to do better. Patient will be seen in follow-up appointment in 6 months or earlier if the patient has any concerns.    Medication Adjustments/Labs and Tests Ordered: Current medicines are reviewed at length with the patient today.  Concerns regarding medicines are outlined above.  Orders Placed This Encounter  Procedures   EKG 12-Lead   No orders of the defined types were placed in this encounter.    No chief complaint on file.    History of Present Illness:    Jason Holland is a 57 y.o. male.  Patient has past medical history of ascending aortic dilatation, coronary arteriosclerosis, essential hypertension, mixed dyslipidemia, abdominal aortic dilatation.  He denies any problems at  this time and takes care of activities of daily living.  No chest pain orthopnea or PND.  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  [place]  I reviewed those records and it revealed no evidence of ischemia on the stress test and CT of the chest revealed thoracic aortic aneurysm which she is known to have.  He denies any chest pain orthopnea or PND.  He mentions to me that he walks 2030 minutes on a regular basis.  Unfortunately continues to chew tobacco and I counseled him against it.  Past Medical History:  Diagnosis Date   AAA (abdominal aortic aneurysm) without rupture (HCC) 04/23/2021   Ascending aorta dilatation (HCC) 04/24/2019   Atherosclerotic coronary vascular disease 07/17/2018   DOE (dyspnea on exertion) 06/13/2018   Essential hypertension 06/13/2018   Gastroesophageal reflux disease without esophagitis 03/13/2023   Hypertriglyceridemia 05/15/2020   Obesity (BMI 30.0-34.9) 08/19/2022   Other fatigue 02/18/2022   Prediabetes 02/18/2022   Primary hypertension 06/13/2018   Statin intolerance 03/13/2023    Past Surgical History:  Procedure Laterality Date   ANKLE SURGERY     CHOLECYSTECTOMY     LEFT HEART CATH AND CORONARY ANGIOGRAPHY N/A 06/19/2018   Procedure: LEFT HEART CATH AND CORONARY ANGIOGRAPHY;  Surgeon: Kathleene Hazel, MD;  Location: [facility];  Service: Cardiovascular;  Laterality: N/A;   TONSILLECTOMY      Current Medications: Current Meds  Medication Sig   albuterol (PROVENTIL HFA;VENTOLIN HFA) 108 (90  Base) MCG/ACT inhaler Inhale 2 puffs into the lungs every 6 (six) hours as needed for wheezing or shortness of breath.    amoxicillin-clavulanate (AUGMENTIN) 875-125 MG tablet Take 1 tablet by mouth 2 (two) times daily.   aspirin EC 81 MG tablet Take 1 tablet (81 mg total) by mouth daily.   fenofibrate (TRICOR) 145 MG tablet Take 1 tablet (145 mg total) by mouth daily.   hydrochlorothiazide (HYDRODIURIL) 25  MG tablet Take 1 tablet by mouth daily.   lisinopril (ZESTRIL) 20 MG tablet Take 1 tablet (20 mg total) by mouth daily.   nitroGLYCERIN (NITROSTAT) 0.4 MG SL tablet Place 0.4 mg under the tongue every 5 (five) minutes as needed for chest pain.   Omega-3 Fatty Acids (FISH OIL) 1000 MG CAPS Take 2 capsules (2,000 mg total) by mouth 2 (two) times daily.     Allergies:   Atorvastatin and Lovaza [omega-3-acid ethyl esters (fish)]   Social History   Socioeconomic History   Marital status: Married    Spouse name: Not on file   Number of children: Not on file   Years of education: Not on file   Highest education level: Not on file  Occupational History   Not on file  Tobacco Use   Smoking status: Never   Smokeless tobacco: Never  Substance and Sexual Activity   Alcohol use: Not on file   Drug use: Not on file   Sexual activity: Not on file  Other Topics Concern   Not on file  Social History Narrative   Not on file   Social Determinants of Health   Financial Resource Strain: Not on file  Food Insecurity: Low Risk  (03/13/2023)   Received from [facility]   Hunger Vital Sign    Worried About Running Out of Food in the Last Year: Never true    Ran Out of Food in the Last Year: Never true  Transportation Needs: No Transportation Needs (03/13/2023)   Received from [facility]    In the past 12 months, has lack of reliable transportation kept you from medical appointments, meetings, work or from getting things needed for daily living? : No  Physical Activity: Not on file  Stress: Not on file  Social Connections: Unknown (11/29/2021)   [facility]   Social Network    Social Network: Not on file     Family History: The patient's family history includes Heart Problems in his father and mother; Hypertension in his father and mother.  ROS:   Please see the history of present illness.    All other systems reviewed and are  negative.  EKGs/Labs/Other Studies Reviewed:    The following studies were reviewed today: I discussed my findings with the patient at length   Recent Labs: No results found for requested labs within last 365 days.  Recent Lipid Panel    Component Value Date/Time   CHOL 168 10/23/2020 0946   TRIG 246 (H) 10/23/2020 0946   HDL 30 (L) 10/23/2020 0946   CHOLHDL 5.6 (H) 10/23/2020 0946   LDLCALC 96 10/23/2020 0946    Physical Exam:    VS:  BP 126/78   Pulse 82   Ht 5\' 7"  (1.702 m)   Wt 203 lb 12.8 oz (92.4 kg)   SpO2 98%   BMI 31.92 kg/m     Wt Readings from Last 3 Encounters:  06/22/23 203 lb 12.8 oz (92.4 kg)  08/19/22 210 lb (95.3  kg)  11/16/21 206 lb 3.2 oz (93.5 kg)     GEN: Patient is in no acute distress HEENT: Normal NECK: No JVD; No carotid bruits LYMPHATICS: No lymphadenopathy CARDIAC: Hear sounds regular, 2/6 systolic murmur at the apex. RESPIRATORY:  Clear to auscultation without rales, wheezing or rhonchi  ABDOMEN: Soft, non-tender, non-distended MUSCULOSKELETAL:  No edema; No deformity  SKIN: Warm and dry NEUROLOGIC:  Alert and oriented x 3 PSYCHIATRIC:  Normal affect   Signed, Garwin Brothers, MD  06/22/2023 2:44 PM    [facility]

## 2023-06-22 NOTE — Patient Instructions (Signed)
Medication Instructions:  Your physician recommends that you continue on your current medications as directed. Please refer to the Current Medication list given to you today.  *If you need a refill on your cardiac medications before your next appointment, please call your pharmacy*   Lab Work: Your physician recommends that you return for lab work in: 3 month for CMP and fasting lipids. You need to have labs done when you are fasting.  You can come Monday through Friday 8:30 am to 12:00 pm and 1:15 to 4:30. You do not need to make an appointment as the order has already been placed.   If you have labs (blood work) drawn today and your tests are completely normal, you will receive your results only by: MyChart Message (if you have MyChart) OR A paper copy in the mail If you have any lab test that is abnormal or we need to change your treatment, we will call you to review the results.   Testing/Procedures: None ordered   Follow-Up: At [facility], you and your health needs are our priority.  As part of our continuing mission to provide you with exceptional heart care, we have created designated Provider Care Teams.  These Care Teams include your primary Cardiologist (physician) and Advanced Practice Providers (APPs -  Physician Assistants and Nurse Practitioners) who all work together to provide you with the care you need, when you need it.  We recommend signing up for the patient portal called "MyChart".  Sign up information is provided on this After Visit Summary.  MyChart is used to connect with patients for Virtual Visits (Telemedicine).  Patients are able to view lab/test results, encounter notes, upcoming appointments, etc.  Non-urgent messages can be sent to your provider as well.   To learn more about what you can do with MyChart, go to [email or web address].    Your next appointment:   9 month(s)  The format for your next appointment:   In Person  Provider:   Belva Crome, MD    Other Instructions none  Important Information About Sugar

## 2023-08-02 IMAGING — CT CT CHEST W/O CM
2 of 3 series · 15 of 36 positions shown, 18 images · non-contrast
Comparison: Chest CT 06/01/2020.

CLINICAL DATA: 55-year-old male with history of aortic disease.
Evaluate for dilatation of the ascending thoracic aorta.

EXAM:
CT CHEST WITHOUT CONTRAST
TECHNIQUE: Multidetector CT imaging of the chest was performed following the
standard protocol without IV contrast.

[Series 2: thorax · axial · 0.78mm/px · z∈[-280,-8]mm · 12 of 160 slices shown, 15 images]
[im 12/160  mediastinal]
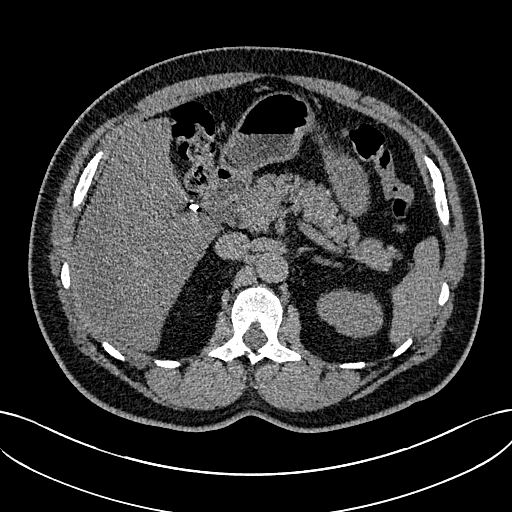
[im 12/160  lung]
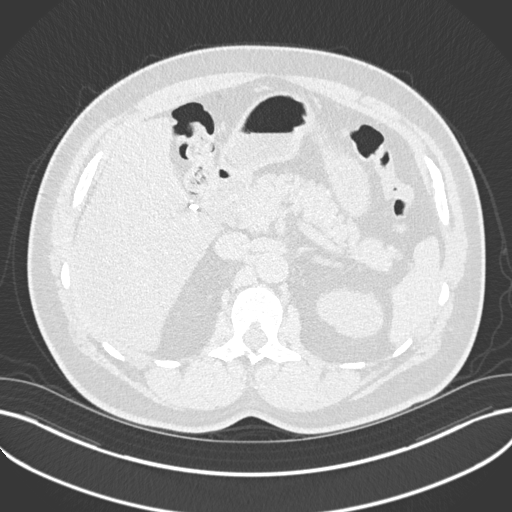
[im 24/160  lung]
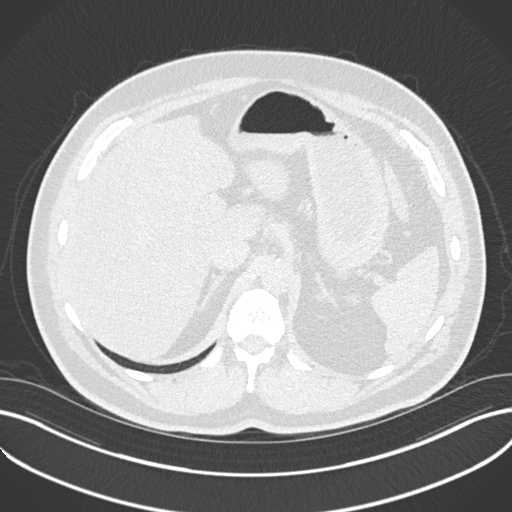
[im 36/160  lung]
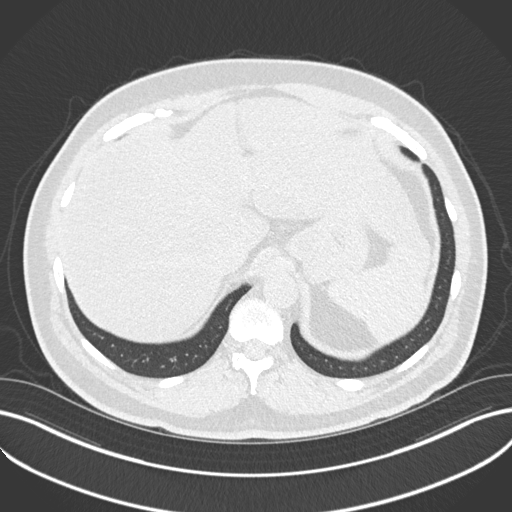
[im 48/160  lung]
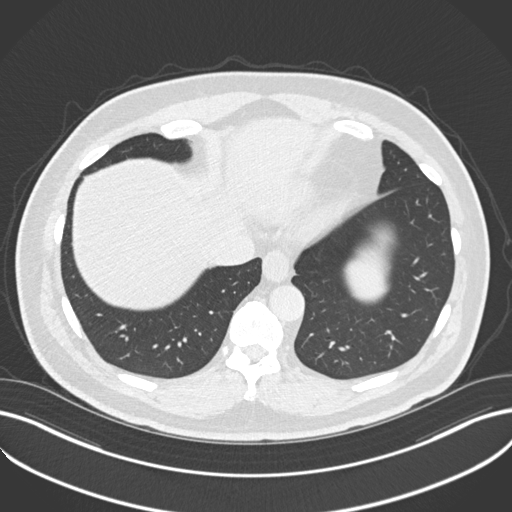
[im 59/160  mediastinal]
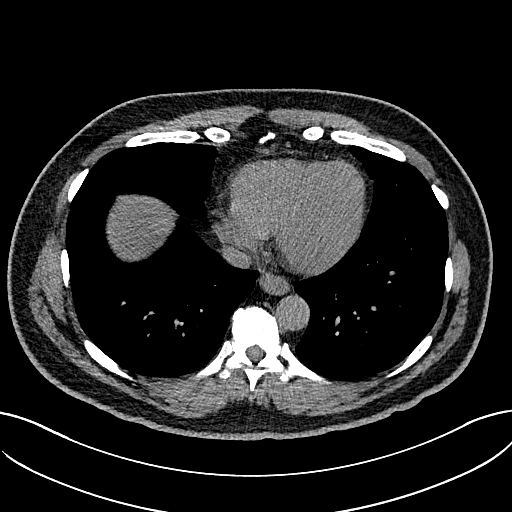
[im 59/160  lung]
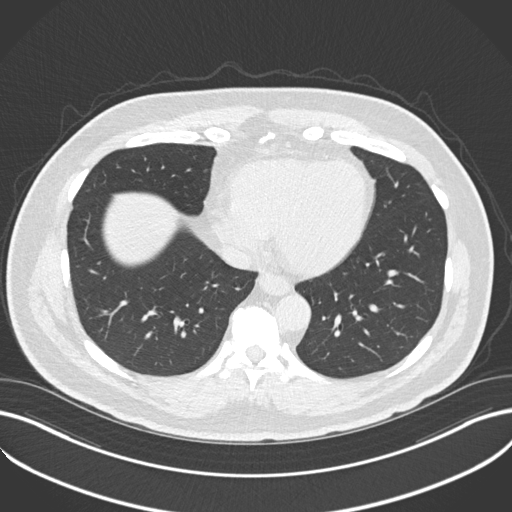
[im 71/160  lung]
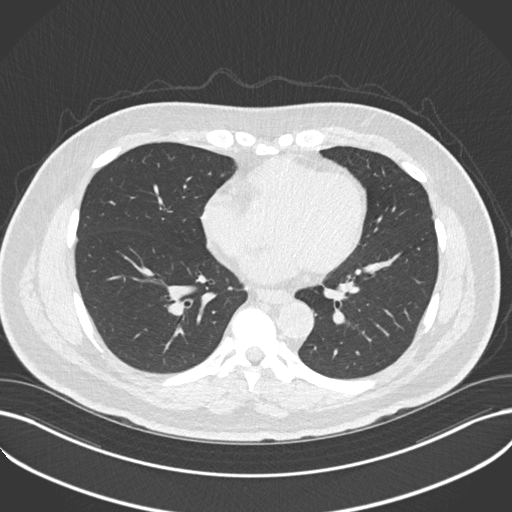
[im 89/160  lung]
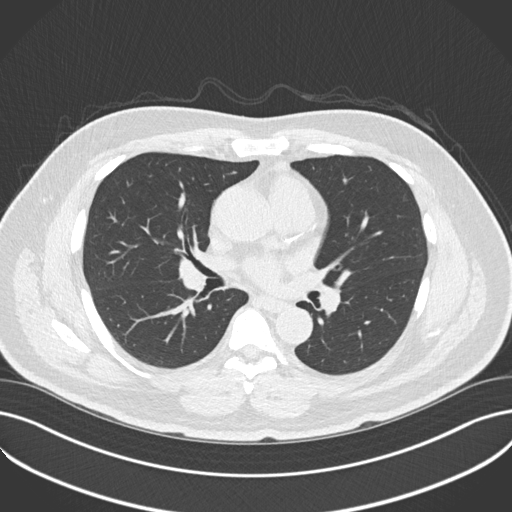
[im 101/160  lung]
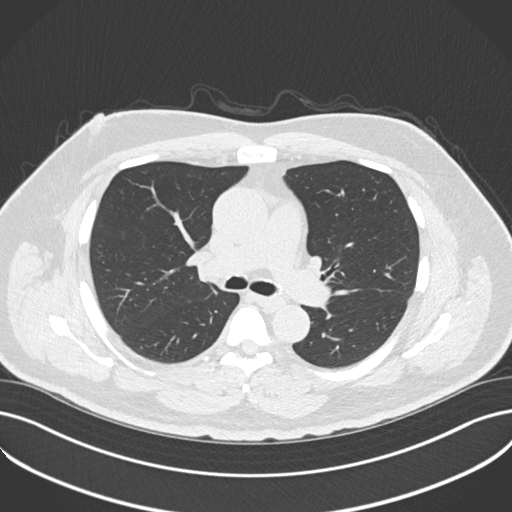
[im 112/160  mediastinal]
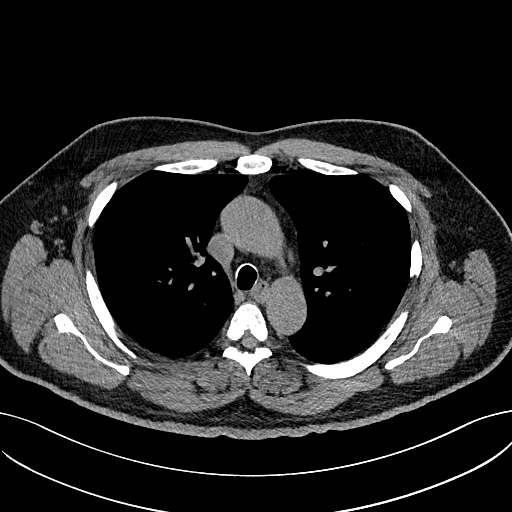
[im 112/160  lung]
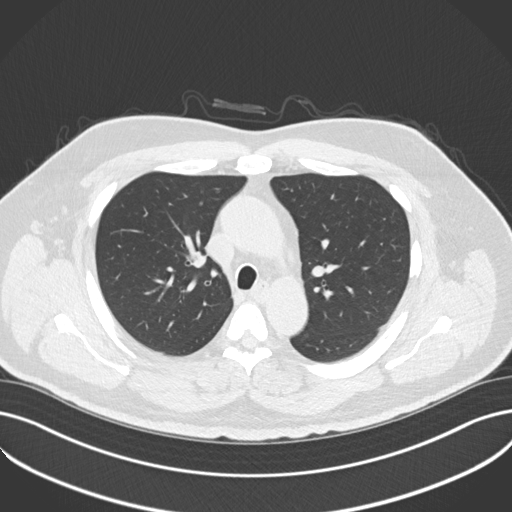
[im 124/160  lung]
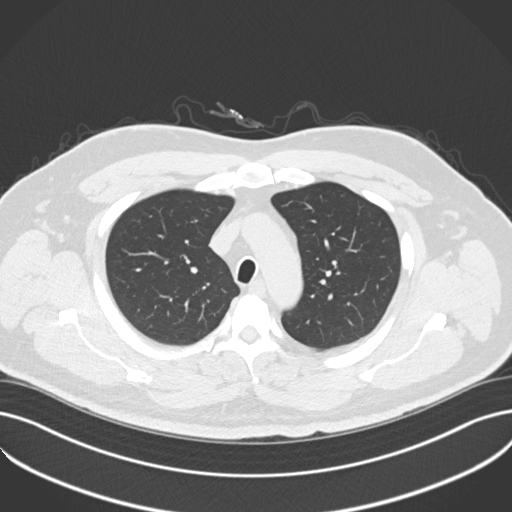
[im 136/160  lung]
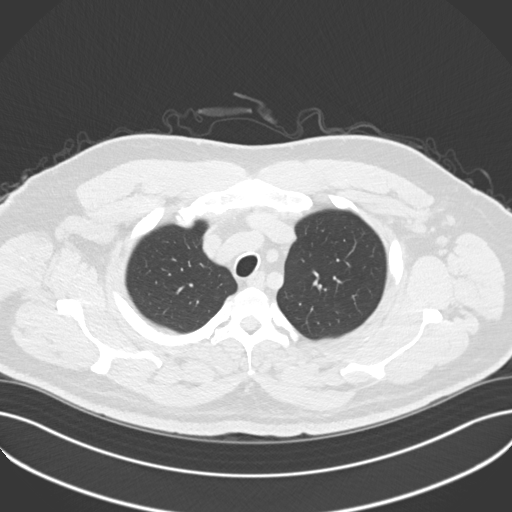
[im 148/160  lung]
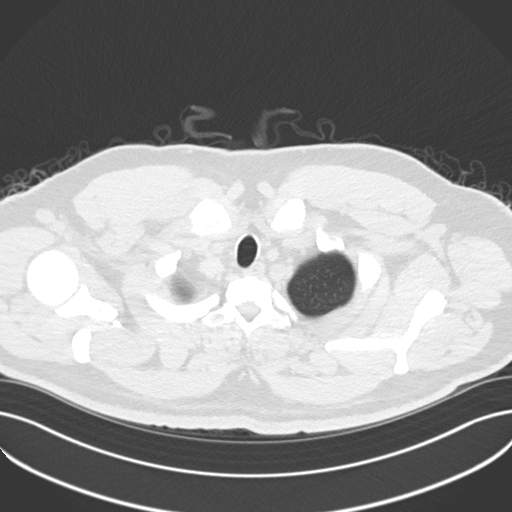

[Series 5: coronal · coronal · 0.64mm/px · 3 of 146 slices shown]
[im 30/146  lung]
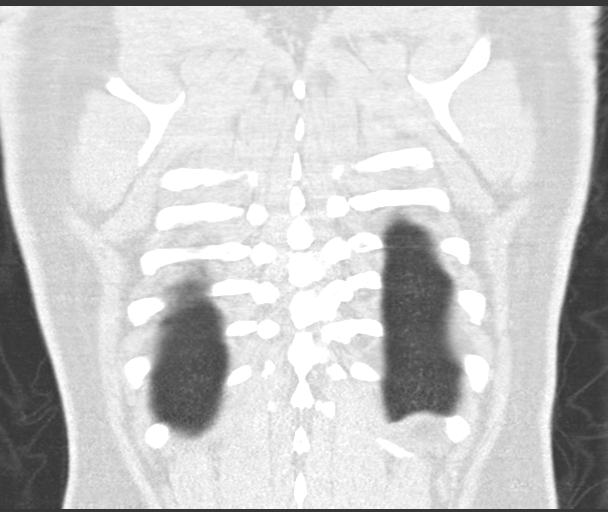
[im 59/146  lung]
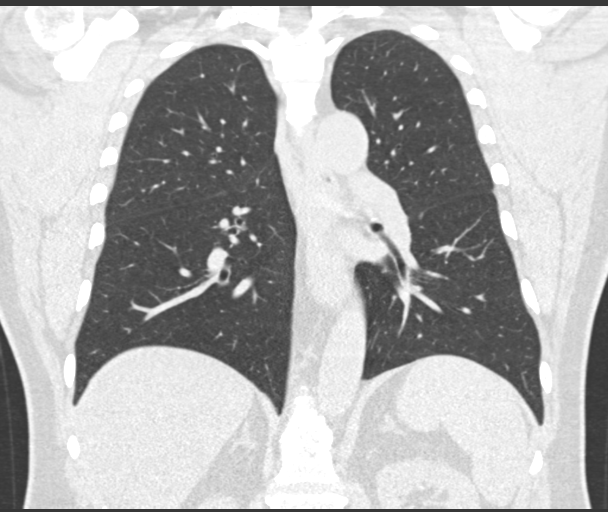
[im 88/146  lung]
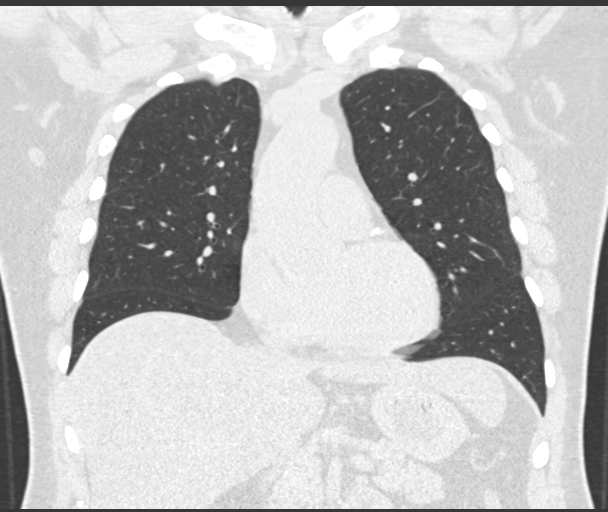

[15 of 36 positions shown; findings below may reference images not displayed]

FINDINGS: Cardiovascular: Heart size is normal. There is no significant
pericardial fluid, thickening or pericardial calcification. There is
aortic atherosclerosis, as well as atherosclerosis of the great
vessels of the mediastinum and the coronary arteries, including
calcified atherosclerotic plaque in the left main and left anterior
descending coronary arteries. There is ectasia of the ascending
thoracic aorta (4.2 cm in diameter), unchanged.

Mediastinum/Nodes: No pathologically enlarged mediastinal or hilar
lymph nodes. Please note that accurate exclusion of hilar adenopathy
is limited on noncontrast CT scans. Esophagus is unremarkable in
appearance. No axillary lymphadenopathy.

Lungs/Pleura: No acute consolidative airspace disease. No pleural
effusions. No suspicious appearing pulmonary nodules or masses are
noted.

Upper Abdomen: Diffuse low attenuation throughout the visualized
hepatic parenchyma, indicative of hepatic steatosis. Status post
cholecystectomy.

Musculoskeletal: There are no aggressive appearing lytic or blastic
lesions noted in the visualized portions of the skeleton.
IMPRESSION: 1. Mild ectasia of the ascending thoracic aorta (4.2 cm in
diameter), unchanged.
2. Aortic atherosclerosis, in addition to left main and left
anterior descending coronary artery disease. Please note that
although the presence of coronary artery calcium documents the
presence of coronary artery disease, the severity of this disease
and any potential stenosis cannot be assessed on this non-gated CT
examination. Assessment for potential risk factor modification,
dietary therapy or pharmacologic therapy may be warranted, if
clinically indicated.
3. Hepatic steatosis.

Aortic Atherosclerosis ([id number]-SVH.[id number]).

## 2024-02-24 ENCOUNTER — Ambulatory Visit (INDEPENDENT_AMBULATORY_CARE_PROVIDER_SITE_OTHER): Admit: 2024-02-24 | Discharge: 2024-02-24 | Disposition: A | Discharge status: Home / Self Care | Admitting: Radiology

## 2024-02-24 ENCOUNTER — Ambulatory Visit (HOSPITAL_BASED_OUTPATIENT_CLINIC_OR_DEPARTMENT_OTHER)
Admission: EM | Admit: 2024-02-24 | Discharge: 2024-02-24 | Disposition: A | Discharge status: Home / Self Care | Attending: Family Medicine | Admitting: Family Medicine

## 2024-02-24 ENCOUNTER — Encounter (HOSPITAL_BASED_OUTPATIENT_CLINIC_OR_DEPARTMENT_OTHER): Payer: Self-pay | Admitting: Emergency Medicine

## 2024-02-24 DIAGNOSIS — R059 Cough, unspecified: Secondary | ICD-10-CM

## 2024-02-24 DIAGNOSIS — J189 Pneumonia, unspecified organism: Secondary | ICD-10-CM

## 2024-02-24 DIAGNOSIS — K219 Gastro-esophageal reflux disease without esophagitis: Secondary | ICD-10-CM

## 2024-02-24 DIAGNOSIS — I10 Essential (primary) hypertension: Secondary | ICD-10-CM

## 2024-02-24 DIAGNOSIS — R490 Dysphonia: Secondary | ICD-10-CM

## 2024-02-24 DIAGNOSIS — J029 Acute pharyngitis, unspecified: Secondary | ICD-10-CM

## 2024-02-24 DIAGNOSIS — R053 Chronic cough: Secondary | ICD-10-CM

## 2024-02-24 MED ORDER — CEFTRIAXONE SODIUM 1 G IJ SOLR
1.0000 g | Freq: Once | INTRAMUSCULAR | Status: AC
  Administered 2024-02-24: 1 g via INTRAMUSCULAR

## 2024-02-24 MED ORDER — DOXYCYCLINE HYCLATE 100 MG PO CAPS: 100.0000 mg | ORAL_CAPSULE | Freq: Two times a day (BID) | ORAL | 0 refills | Status: AC

## 2024-02-24 MED ORDER — TRIAMCINOLONE ACETONIDE 40 MG/ML IJ SUSP
40.0000 mg | Freq: Once | INTRAMUSCULAR | Status: AC
  Administered 2024-02-24: 40 mg via INTRAMUSCULAR

## 2024-02-24 MED ORDER — ALBUTEROL SULFATE HFA 108 (90 BASE) MCG/ACT IN AERS: 2.0000 | INHALATION_SPRAY | RESPIRATORY_TRACT | 0 refills | Status: AC | PRN

## 2024-02-24 MED ORDER — LOSARTAN POTASSIUM 50 MG PO TABS: 50.0000 mg | ORAL_TABLET | Freq: Every day | ORAL | 0 refills | Status: DC

## 2024-02-24 MED ORDER — ESOMEPRAZOLE MAGNESIUM 40 MG PO CPDR: 40.0000 mg | DELAYED_RELEASE_CAPSULE | Freq: Every day | ORAL | 0 refills | Status: AC

## 2024-02-24 MED ORDER — PROMETHAZINE-DM 6.25-15 MG/5ML PO SYRP: 5.0000 mL | ORAL_SOLUTION | Freq: Four times a day (QID) | ORAL | 0 refills | Status: AC | PRN

## 2024-02-24 MED ORDER — COMPACT SPACE CHAMBER DEVI: 0 refills | Status: AC

## 2024-02-24 NOTE — ED Provider Notes (Signed)
 [facility]    CSN: [id number] Arrival date & time: 02/24/24  0909      History   Chief Complaint Chief Complaint  Patient presents with   Cough   Hoarse    HPI REEGAN BOUFFARD is a 58 y.o. male.   58 year old male and never smoker with chronic cough and hoarse voice since late February 2025.  He sometimes coughs something up but mostly he just has a spasmodic cough that at times has him coughing so much he feels like he is going to pass out.  Symptoms have gotten significantly worse since about 02/07/2024.  In February and May his wife had him trying some leftover prednisone that he had from pneumonia couple years ago he only took 1 or 2 prednisone.  She also had him taking some Augmentin 875 but he is only taken a couple of those since February.  He has not seen anyone about this complaint.  He is concerned about how its gotten significantly worse recently.   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   Atherosclerotic coronary vascular disease 07/17/2018   DOE (dyspnea on exertion) 06/13/2018   Essential hypertension 06/13/2018   Gastroesophageal reflux disease without esophagitis 03/13/2023   Hypertriglyceridemia 05/15/2020   Obesity (BMI 30.0-34.9) 08/19/2022   Other fatigue 02/18/2022   Prediabetes 02/18/2022   Primary hypertension 06/13/2018   Statin intolerance 03/13/2023    Patient Active Problem List   Diagnosis Date Noted   Gastroesophageal reflux disease without esophagitis 03/13/2023   Statin intolerance 03/13/2023   Obesity (BMI 30.0-34.9) 08/19/2022   Other fatigue 02/18/2022   Prediabetes 02/18/2022   AAA (abdominal aortic aneurysm) without rupture (HCC) 04/23/2021   Hypertriglyceridemia  05/15/2020   Ascending aorta dilatation (HCC) 04/24/2019   Atherosclerotic coronary vascular disease 07/17/2018   DOE (dyspnea on exertion) 06/13/2018   Essential hypertension 06/13/2018   Primary hypertension 06/13/2018    Past Surgical History:  Procedure Laterality Date   ANKLE SURGERY     CHOLECYSTECTOMY     LEFT HEART CATH AND CORONARY ANGIOGRAPHY N/A 06/19/2018   Procedure: LEFT HEART CATH AND CORONARY ANGIOGRAPHY;  Surgeon: Verlin Lonni BIRCH, MD;  Location: [facility];  Service: Cardiovascular;  Laterality: N/A;   TONSILLECTOMY         Home Medications    Prior to Admission medications   Medication Sig Start Date End Date Taking? Authorizing Provider  albuterol  (VENTOLIN  HFA) 108 (90 Base) MCG/ACT inhaler Inhale 2 puffs into the lungs every 4 (four) hours as needed for wheezing or shortness of breath. 02/24/24  Yes Ival Domino, FNP  aspirin  EC 81 MG tablet Take 1 tablet (81 mg total) by mouth daily. 06/13/18  Yes Revankar, Jennifer JONELLE, MD  doxycycline  (VIBRAMYCIN ) 100 MG capsule Take 1 capsule (100 mg total) by mouth 2 (two) times daily for 10 days. 02/24/24 03/05/24 Yes Ival Domino, FNP  esomeprazole  (NEXIUM ) 40 MG capsule Take 1 capsule (40 mg total) by mouth daily. 02/24/24  Yes Ival Domino, FNP  hydrochlorothiazide (HYDRODIURIL) 25 MG tablet Take 1 tablet by mouth daily. 05/21/18  Yes [provider]  losartan  (COZAAR ) 50 MG tablet Take 1 tablet (50 mg total) by mouth daily. 02/24/24 03/25/24 Yes Jasmon Graffam,  Lauraine, FNP  promethazine -dextromethorphan (PROMETHAZINE -DM) 6.25-15 MG/5ML syrup Take 5 mLs by mouth 4 (four) times daily as needed for cough. Do not use and drive - May make drowsy. 02/24/24  Yes Ival Lauraine, FNP  Spacer/Aero-Holding Chambers (COMPACT SPACE CHAMBER) DEVI Use with the albuterol  inhaler 02/24/24  Yes Ival Lauraine, FNP  fenofibrate  (TRICOR ) 145 MG tablet Take 1 tablet (145 mg total) by mouth daily. 11/16/21   Revankar, Jennifer SAUNDERS, MD  nitroGLYCERIN   (NITROSTAT ) 0.4 MG SL tablet Place 0.4 mg under the tongue every 5 (five) minutes as needed for chest pain.    [provider]  Omega-3 Fatty Acids (FISH OIL ) 1000 MG CAPS Take 2 capsules (2,000 mg total) by mouth 2 (two) times daily. 06/13/18   Revankar, Jennifer SAUNDERS, MD    Family History Family History  Problem Relation Age of Onset   Heart Problems Mother    Hypertension Mother    Heart Problems Father    Hypertension Father     Social History Social History   Tobacco Use   Smoking status: Never   Smokeless tobacco: Never     Allergies   Atorvastatin and Lovaza  [omega-3-acid  ethyl esters (fish)]   Review of Systems Review of Systems  Constitutional:  Negative for chills and fever.  HENT:  Negative for ear pain and sore throat.   Eyes:  Negative for pain and visual disturbance.  Respiratory:  Positive for cough, choking and shortness of breath.   Cardiovascular:  Negative for chest pain and palpitations.  Gastrointestinal:  Negative for abdominal pain, constipation, diarrhea, nausea and vomiting.  Genitourinary:  Negative for dysuria and hematuria.  Musculoskeletal:  Negative for arthralgias and back pain.  Skin:  Negative for color change and rash.  Neurological:  Negative for seizures and syncope.  All other systems reviewed and are negative.    Physical Exam Triage Vital Signs ED Triage Vitals  Encounter Vitals Group     BP 02/24/24 0944 121/81     Girls Systolic BP Percentile --      Girls Diastolic BP Percentile --      Boys Systolic BP Percentile --      Boys Diastolic BP Percentile --      Pulse Rate 02/24/24 0944 74     Resp 02/24/24 0944 18     Temp 02/24/24 0944 98.2 F (36.8 C)     Temp Source 02/24/24 0944 Oral     SpO2 02/24/24 0944 95 %     Weight --      Height --      Head Circumference --      Peak Flow --      Pain Score 02/24/24 0943 0     Pain Loc --      Pain Education --      Exclude from Growth Chart --    No data  found.  Updated Vital Signs BP 121/81 (BP Location: Right Arm)   Pulse 74   Temp 98.2 F (36.8 C) (Oral)   Resp 18   SpO2 95%   Visual Acuity Right Eye Distance:   Left Eye Distance:   Bilateral Distance:    Right Eye Near:   Left Eye Near:    Bilateral Near:     Physical Exam Vitals and nursing note reviewed.  Constitutional:      General: He is not in acute distress.    Appearance: He is well-developed. He is not ill-appearing, toxic-appearing or diaphoretic.  HENT:  Head: Normocephalic and atraumatic.     Right Ear: Hearing, tympanic membrane, ear canal and external ear normal.     Left Ear: Hearing, tympanic membrane, ear canal and external ear normal.     Nose: Congestion and rhinorrhea present. Rhinorrhea is clear.     Right Sinus: No maxillary sinus tenderness or frontal sinus tenderness.     Left Sinus: No maxillary sinus tenderness or frontal sinus tenderness.     Mouth/Throat:     Lips: Pink.     Mouth: Mucous membranes are moist.     Pharynx: Uvula midline. No oropharyngeal exudate or posterior oropharyngeal erythema.     Tonsils: No tonsillar exudate.  Eyes:     Conjunctiva/sclera: Conjunctivae normal.     Pupils: Pupils are equal, round, and reactive to light.  Cardiovascular:     Rate and Rhythm: Normal rate and regular rhythm.     Heart sounds: S1 normal and S2 normal. No murmur heard. Pulmonary:     Effort: Pulmonary effort is normal. No respiratory distress.     Breath sounds: Normal breath sounds. No decreased breath sounds, wheezing, rhonchi or rales.     Comments: Lung sounds are clear but oxygen saturation is 93% on room air. Abdominal:     General: Bowel sounds are normal.     Palpations: Abdomen is soft.     Tenderness: There is abdominal tenderness (Mild) in the right upper quadrant. There is no right CVA tenderness, left CVA tenderness, guarding or rebound. Negative signs include Murphy's sign, Rovsing's sign and McBurney's sign.   Musculoskeletal:        General: No swelling.     Cervical back: Neck supple.  Lymphadenopathy:     Head:     Right side of head: No submental, submandibular, tonsillar, preauricular or posterior auricular adenopathy.     Left side of head: No submental, submandibular, tonsillar, preauricular or posterior auricular adenopathy.     Cervical: No cervical adenopathy.     Right cervical: No superficial cervical adenopathy.    Left cervical: No superficial cervical adenopathy.  Skin:    General: Skin is warm and dry.     Capillary Refill: Capillary refill takes less than 2 seconds.     Findings: No rash.  Neurological:     Mental Status: He is alert and oriented to person, place, and time.  Psychiatric:        Mood and Affect: Mood normal.      UC Treatments / Results  Labs (all labs ordered are listed, but only abnormal results are displayed) Labs Reviewed - No data to display  EKG   Radiology DG Chest 2 View Result Date: 02/24/2024 CLINICAL DATA:  Cough. EXAM: CHEST - 2 VIEW COMPARISON:  06/05/2018 FINDINGS: The heart size and mediastinal contours are within normal limits. Both lungs are clear. The visualized skeletal structures are unremarkable. IMPRESSION: No active cardiopulmonary disease. Electronically Signed   By: Norleen DELENA Kil M.D.   On: 02/24/2024 11:19    Procedures Procedures (including critical care time)  Medications Ordered in UC Medications  cefTRIAXone  (ROCEPHIN ) injection 1 g (1 g Intramuscular Given 02/24/24 1121)  triamcinolone  acetonide (KENALOG -40) injection 40 mg (40 mg Intramuscular Given 02/24/24 1121)    Initial Impression / Assessment and Plan / UC Course  I have reviewed the triage vital signs and the nursing notes.  Pertinent labs & imaging results that were available during my care of the patient were reviewed by me and considered in my  medical decision making (see chart for details).  Plan of Care: Community-acquired pneumonia and chronic cough:  Chest x-ray shows a large hazy area in the posterior lungs but no clear consolidation.  Based on his history and exam findings, will treat for community-acquired pneumonia.  Ceftriaxone  1000 mg IM now.  Kenalog  40 mg IM now.  Doxycycline  100 mg twice daily for 10 days.  Promethazine  DM, 5 mL, every 6 hours if needed for cough.  Albuterol  inhaler with spacer, 2 puffs every 4 hours as needed for wheezing.  Provided education on how to use an inhaler and spacer.  Patient stayed for 15 minutes after his injection to make sure he tolerated the ceftriaxone  and Kenalog  without problems.  Follow-up with family practice regarding recheck of lungs.  Hoarse voice: Make an appointment with the ENT for further evaluation.  Sore throat and probable GERD: Esomeprazole  40 mg daily for a month.  Follow-up with primary care and may need to see gastroenterology.  Hypertension: Stop the lisinopril.  Patient's had a chronic cough for over 4 months.  It may be due to other causes but will stop the lisinopril and switch to losartan  50 mg daily.  Continue hydrochlorothiazide also for blood pressure.  Follow-up with primary care for management and recheck of blood pressure after this medication change.  Follow-up if symptoms do not improve, worsen or new symptoms occur.  I reviewed the plan of care with the patient and/or the patient's guardian.  The patient and/or guardian had time to ask questions and acknowledged that the questions were answered.  I provided instruction on symptoms or reasons to return here or to go to an ER, if symptoms/condition did not improve, worsened or if new symptoms occurred.  Final Clinical Impressions(s) / UC Diagnoses   Final diagnoses:  Chronic cough  Sore throat  Hoarseness of voice  Essential hypertension  Gastroesophageal reflux disease without esophagitis  Community acquired pneumonia, unspecified laterality     Discharge Instructions      Community-acquired pneumonia and  chronic cough: Chest x-ray shows significant hazy areas but no specific consolidation.  Will treat for community-acquired pneumonia based on chest x-ray, exam findings and history or symptoms.  Ceftriaxone  1000 mg injection during the visit (this is an antibiotic).  Kenalog  40 mg injection during the visit (this is a steroid).  Doxycycline  100 mg twice daily for 10 days.  Albuterol  inhaler with spacer, 2 puffs every 4 hours if needed for wheezing or chronic cough.  Promethazine  DM, 5 mL, every 6 hours as needed for cough.  Follow-up with family practice for recheck in 1 to 3 weeks.  Hoarse voice: Due to the duration of his symptoms, he needs to see ENT Clayborn Killian, DO) for further workup of this condition.  Sore throat and GERD: Throat exam is normal.  Try esomeprazole  40 mg once daily for a month.  Follow-up with primary care about this concern.  May need to see gastroenterologist.  Hypertension: Given a chronic cough lasting more than 4 months, he should stop the lisinopril.  Start losartan  50 mg daily for blood pressure control.  Continue hydrochlorothiazide for blood pressure control.  Follow-up with primary care regarding hypertension and blood pressure management.  Follow-up if symptoms do not improve, worsen or new symptoms occur.     ED Prescriptions     Medication Sig Dispense Auth. Provider   losartan  (COZAAR ) 50 MG tablet Take 1 tablet (50 mg total) by mouth daily. 30 tablet Melbert Botelho, FNP  esomeprazole  (NEXIUM ) 40 MG capsule Take 1 capsule (40 mg total) by mouth daily. 30 capsule Ival Domino, FNP   albuterol  (VENTOLIN  HFA) 108 (90 Base) MCG/ACT inhaler Inhale 2 puffs into the lungs every 4 (four) hours as needed for wheezing or shortness of breath. 1 each Ival Domino, FNP   Spacer/Aero-Holding Chambers (COMPACT SPACE CHAMBER) DEVI Use with the albuterol  inhaler 1 each Ival Domino, FNP   doxycycline  (VIBRAMYCIN ) 100 MG capsule Take 1 capsule (100 mg total) by mouth 2 (two)  times daily for 10 days. 20 capsule Ival Domino, FNP   promethazine -dextromethorphan (PROMETHAZINE -DM) 6.25-15 MG/5ML syrup Take 5 mLs by mouth 4 (four) times daily as needed for cough. Do not use and drive - May make drowsy. 118 mL Ival Domino, FNP      PDMP not reviewed this encounter.   Ival Domino, FNP 02/24/24 1159

## 2024-02-24 NOTE — ED Triage Notes (Signed)
 Pt reports since February his voice comes and goes and at times it feels like something is in his throat. Pt states if he coughs a lot he gets light headed and almost passes out. Pt reports it has gotten worse over tha past 2 weeks. Pt has not tried any medications.

## 2024-02-24 NOTE — Discharge Instructions (Addendum)
 Community-acquired pneumonia and chronic cough: Chest x-ray shows significant hazy areas but no specific consolidation.  Will treat for community-acquired pneumonia based on chest x-ray, exam findings and history or symptoms.  Ceftriaxone  1000 mg injection during the visit (this is an antibiotic).  Kenalog  40 mg injection during the visit (this is a steroid).  Doxycycline  100 mg twice daily for 10 days.  Albuterol  inhaler with spacer, 2 puffs every 4 hours if needed for wheezing or chronic cough.  Promethazine  DM, 5 mL, every 6 hours as needed for cough.  Follow-up with family practice for recheck in 1 to 3 weeks.  Hoarse voice: Due to the duration of his symptoms, he needs to see ENT Clayborn Killian, DO) for further workup of this condition.  Sore throat and GERD: Throat exam is normal.  Try esomeprazole  40 mg once daily for a month.  Follow-up with primary care about this concern.  May need to see gastroenterologist.  Hypertension: Given a chronic cough lasting more than 4 months, he should stop the lisinopril.  Start losartan  50 mg daily for blood pressure control.  Continue hydrochlorothiazide for blood pressure control.  Follow-up with primary care regarding hypertension and blood pressure management.  Follow-up if symptoms do not improve, worsen or new symptoms occur.

## 2024-03-26 ENCOUNTER — Ambulatory Visit: Admitting: Cardiology

## 2024-04-09 DIAGNOSIS — R490 Dysphonia: Secondary | ICD-10-CM | POA: Insufficient documentation

## 2024-04-11 ENCOUNTER — Ambulatory Visit: Admitting: Cardiology

## 2024-04-11 ENCOUNTER — Ambulatory Visit

## 2024-04-11 VITALS — BP 138/74 | HR 92 | Ht 66.0 in | Wt 209.8 lb

## 2024-04-11 DIAGNOSIS — E781 Pure hyperglyceridemia: Secondary | ICD-10-CM | POA: Diagnosis not present

## 2024-04-11 DIAGNOSIS — I714 Abdominal aortic aneurysm, without rupture, unspecified: Secondary | ICD-10-CM

## 2024-04-11 DIAGNOSIS — I2510 Atherosclerotic heart disease of native coronary artery without angina pectoris: Secondary | ICD-10-CM | POA: Diagnosis not present

## 2024-04-11 DIAGNOSIS — Z01810 Encounter for preprocedural cardiovascular examination: Secondary | ICD-10-CM | POA: Insufficient documentation

## 2024-04-11 DIAGNOSIS — I10 Essential (primary) hypertension: Secondary | ICD-10-CM | POA: Diagnosis not present

## 2024-04-11 NOTE — Progress Notes (Signed)
 Cardiology Consultation:    Date:  04/11/2024   ID:  KAO CONRY, DOB 1966-07-09, MRN [id number]  PCP:  Jackolyn Darice BROCKS, FNP  Cardiologist:  Alean JONELLE Jacoba Cherney, MD   Referring MD: Jackolyn Darice BROCKS, FNP   No chief complaint on file.    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 Delayne.    Problem List Items Addressed This Visit     Essential hypertension   Near optimal control. Continue hydrochlorothiazide 25 mg once daily Continue lisinopril 20 mg once daily Target below 130/80 mmHg.      Relevant Medications   lisinopril (ZESTRIL) 20 MG tablet   Coronary atherosclerosis   Coronary atherosclerosis reported on CTA chest 06/03/2023 in [place] area [facility].  No prior history of MI. Lexiscan stress test nuclear imaging November 2024 at [facility] in [place]  reported no ischemia.  Continue aggressive lipid-lowering therapy with dietary modifications and medications as tolerated. Currently on aspirin  81 mg once daily, tolerating well.  Discussed about risk factor modifications, weight loss for overall cardiovascular risk reduction.        Relevant Medications   lisinopril (ZESTRIL) 20 MG tablet   Hypertriglyceridemia   Hide triglyceridemia. Recent lipid panel 03/25/2024 with HDL 41, LDL 102, triglycerides 311, total cholesterol 184.  Advised importance of dietary modifications and managing triglycerides. He did not tolerate statins due to various side effects predominantly muscle aches. Unclear but mentions he did not tolerate Zetia. Currently on fish oil  and tolerating it well.  He is currently hesitant to start any new medications  as he feels he has been able to aggressively modify his diet over the last couple weeks and wants to continue doing the same. Will repeat fasting lipid panel in 3 months.  If triglycerides are not below 200 mg/dL I would recommend evaluation with lipid clinic to see if he would qualify for any lipid-lowering trials.  He is agreeable with this plan.      Relevant Medications   lisinopril (ZESTRIL) 20 MG tablet   AAA (abdominal aortic aneurysm) without rupture - Primary   This was listed in his chart.  However CT abdomen pelvis 06/03/2023 report from [facility] noted no aneurysm of the abdominal aorta.  Reassuring findings.       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 No chest pain. 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  From cardiac standpoint normal biventricular function on echocardiogram normal 2024 without any major valve abnormalities. Stress test Lexiscan with nuclear imaging November 2024 without ischemia.  From cardiac standpoint okay to proceed with his surgery as being planned. No further cardiac workup required. Aspirin  can be interrupted as needed for the procedure.       Return to clinic tentatively for follow-up after his CT chest without contrast imaging tentatively in January or February 2026.SABRA   History of Present Illness:    Jason Holland is a 58 y.o. male who is being seen today  for follow-up visit. PCP is Goins, Darice BROCKS, FNP. Last visit with our office was 06/22/2023 with Dr. Revankar.  Very pleasant gentleman here for the visit today accompanied by his wife.  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 Delayne.  Echocardiogram 06/03/2023 at [facility] in [place]  noted LVEF 55 to 60%, aortic root diameter 4.3 cm, mild to moderate pulmonary insufficiency Lexiscan Stress test with nuclear imaging reported no ischemia November 2024 at [facility]  CT abdomen and pelvis 06/03/2023 reported no aneurysm of the aorta.  CTA chest 06/03/2023 mild dilatation of the aortic root measuring 4.4 cm.  Moderate coronary atherosclerosis.  Lipid panel from 06/04/2023 total cholesterol 145, triglycerides 387, HDL 26, LDL 42  Recent repeat blood work from 03/25/2024 noted total cholesterol 184, triglycerides 311, HDL 41, LDL 102. Hemoglobin A1c 7. TSH normal 4.42 Sodium 138, potassium 3.7, BUN 18, creatinine 0.92, eGFR greater than 90. Normal transaminases and alkaline phosphatase. CBC unremarkable with hemoglobin 15.7, hematocrit 46.1, platelets 250, WBC 5.8.  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 Associated with occasional dysphagia to solid foods.  This also affects his breathing at times as it is compromising the size of the upper airway. No chest pain No palpitations, lightheadedness, dizziness or syncopal episodes.  Unable to escalate his lipid-lowering regimen due to intolerance to medications. Mentions he has been aggressively modifying his diet.  Past Medical History:  Diagnosis Date   AAA (abdominal aortic aneurysm) without rupture 04/23/2021   Ascending aorta dilatation 04/24/2019   Atherosclerotic coronary vascular disease 07/17/2018   DOE (dyspnea on exertion) 06/13/2018   Essential hypertension 06/13/2018   Gastroesophageal reflux  disease without esophagitis 03/13/2023   Hypertriglyceridemia 05/15/2020   Obesity (BMI 30.0-34.9) 08/19/2022   Other fatigue 02/18/2022   Prediabetes 02/18/2022   Primary hypertension 06/13/2018   Statin intolerance 03/13/2023    Past Surgical History:  Procedure Laterality Date   ANKLE SURGERY     CHOLECYSTECTOMY     LEFT HEART CATH AND CORONARY ANGIOGRAPHY N/A 06/19/2018   Procedure: LEFT HEART CATH AND CORONARY ANGIOGRAPHY;  Surgeon: Verlin Lonni BIRCH, MD;  Location: [facility] INVASIVE CV LAB;  Service: Cardiovascular;  Laterality: N/A;   TONSILLECTOMY      Current Medications: Current Meds  Medication Sig   albuterol  (VENTOLIN  HFA) 108 (90 Base) MCG/ACT inhaler Inhale 2 puffs into the lungs every 4 (four) hours as needed for wheezing or shortness of breath.   aspirin  EC 81 MG tablet Take 1 tablet (81 mg total) by mouth daily.   esomeprazole  (NEXIUM ) 40 MG capsule Take 1 capsule (40 mg total) by mouth daily.   fenofibrate  (TRICOR ) 145 MG tablet Take 1 tablet (145 mg total) by mouth daily.   hydrochlorothiazide (HYDRODIURIL) 25 MG tablet Take 1 tablet by mouth daily.   lisinopril (ZESTRIL) 20 MG tablet Take 20 mg by mouth daily.   meloxicam (MOBIC) 7.5 MG tablet Take 7.5 mg by mouth daily.   methocarbamol (ROBAXIN) 750 MG tablet Take 750 mg by mouth every 8 (eight) hours as needed.   nitroGLYCERIN  (NITROSTAT ) 0.4 MG SL tablet Place 0.4 mg  under the tongue every 5 (five) minutes as needed for chest pain.   Omega-3 Fatty Acids (FISH OIL ) 1000 MG CAPS Take 2 capsules (2,000 mg total) by mouth 2 (two) times daily.   promethazine -dextromethorphan (PROMETHAZINE -DM) 6.25-15 MG/5ML syrup Take 5 mLs by mouth 4 (four) times daily as needed for cough. Do not use and drive - May make drowsy.   Spacer/Aero-Holding Chambers (COMPACT SPACE CHAMBER) DEVI Use with the albuterol  inhaler     Allergies:   Atorvastatin and Lovaza  [omega-3-acid  ethyl esters (fish)]   Social History   Socioeconomic  History   Marital status: Married    Spouse name: Not on file   Number of children: Not on file   Years of education: Not on file   Highest education level: Not on file  Occupational History   Not on file  Tobacco Use   Smoking status: Never   Smokeless tobacco: Never  Substance and Sexual Activity   Alcohol use: Not on file   Drug use: Not on file   Sexual activity: Not on file  Other Topics Concern   Not on file  Social History Narrative   Not on file   Social Drivers of Health   Financial Resource Strain: Not on file  Food Insecurity: Low Risk  (03/13/2023)   Received from [facility]   Hunger Vital Sign    Within the past 12 months, you worried that your food would run out before you got money to buy more: Never true    Within the past 12 months, the food you bought just didn't last and you didn't have money to get more. : Never true  Transportation Needs: No Transportation Needs (03/13/2023)   Received from [facility]    In the past 12 months, has lack of reliable transportation kept you from medical appointments, meetings, work or from getting things needed for daily living? : No  Physical Activity: Not on file  Stress: Not on file  Social Connections: Unknown (11/29/2021)   Received from [facility]   Social Network    Social Network: Not on file     Family History: The patient's family history includes Heart Problems in his father and mother; Hypertension in his father and mother. ROS:   Please see the history of present illness.    All 14 point review of systems negative except as described per history of present illness.  EKGs/Labs/Other Studies Reviewed:    The following studies were reviewed today:   EKG:       Recent Labs: No results found for requested labs within last 365 days.  Recent Lipid Panel    Component Value Date/Time   CHOL 168 10/23/2020 0946   TRIG 246 (H) 10/23/2020 0946   HDL 30 (L) 10/23/2020 0946   CHOLHDL  5.6 (H) 10/23/2020 0946   LDLCALC 96 10/23/2020 0946    Physical Exam:    VS:  BP 138/74   Pulse 92   Ht 5' 6 (1.676 m)   Wt 209 lb 12.8 oz (95.2 kg)   SpO2 97%   BMI 33.86 kg/m     Wt Readings from Last 3 Encounters:  04/11/24 209 lb 12.8 oz (95.2 kg)  06/22/23 203 lb 12.8 oz (92.4 kg)  08/19/22 210 lb (95.3 kg)     GENERAL:  Well nourished, well developed in no acute distress NECK: No JVD; No carotid bruits CARDIAC: RRR, S1 and S2 present, no murmurs, no rubs, no gallops CHEST:  Clear to auscultation without rales, wheezing or rhonchi  Extremities: No pitting pedal edema. Pulses bilaterally symmetric with radial 2+ and dorsalis pedis 2+ NEUROLOGIC:  Alert and oriented x 3  Medication Adjustments/Labs and Tests Ordered: Current medicines are reviewed at length with the patient today.  Concerns regarding medicines are outlined above.  Orders Placed This Encounter  Procedures   CT Chest Wo Contrast   Lipid Profile   No orders of the defined types were placed in this encounter.   Signed, Alean jess Kobus, MD, MPH, [facility]. 04/11/2024 3:43 PM    [facility]

## 2024-04-11 NOTE — Patient Instructions (Signed)
 Medication Instructions:  Your physician recommends that you continue on your current medications as directed. Please refer to the Current Medication list given to you today.  *If you need a refill on your cardiac medications before your next appointment, please call your pharmacy*  Lab Work: Your physician recommends that you return for lab work in:   Labs in 3 month: Lipids  If you have labs (blood work) drawn today and your tests are completely normal, you will receive your results only by: MyChart Message (if you have MyChart) OR A paper copy in the mail If you have any lab test that is abnormal or we need to change your treatment, we will call you to review the results.  Testing/Procedures: Non-Cardiac CT scanning, (CAT scanning), is a noninvasive, special x-ray that produces cross-sectional images of the body using x-rays and a computer. CT scans help physicians diagnose and treat medical conditions. For some CT exams, a contrast material is used to enhance visibility in the area of the body being studied. CT scans provide greater clarity and reveal more details than regular x-ray exams.   Follow-Up: At [facility], you and your health needs are our priority.  As part of our continuing mission to provide you with exceptional heart care, our providers are all part of one team.  This team includes your primary Cardiologist (physician) and Advanced Practice Providers or APPs (Physician Assistants and Nurse Practitioners) who all work together to provide you with the care you need, when you need it.  Your next appointment:   6 month(s)  Provider:   Alean Kobus, MD    We recommend signing up for the patient portal called MyChart.  Sign up information is provided on this After Visit Summary.  MyChart is used to connect with patients for Virtual Visits (Telemedicine).  Patients are able to view lab/test results, encounter notes, upcoming appointments, etc.  Non-urgent  messages can be sent to your provider as well.   To learn more about what you can do with MyChart, go to [email or web address].   Other Instructions None

## 2024-04-11 NOTE — Assessment & Plan Note (Signed)
 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 No chest pain. 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  From cardiac standpoint normal biventricular function on echocardiogram normal 2024 without any major valve abnormalities. Stress test Lexiscan with nuclear imaging November 2024 without ischemia.  From cardiac standpoint okay to proceed with his surgery as being planned. No further cardiac workup required. Aspirin  can be interrupted as needed for the procedure.

## 2024-04-11 NOTE — Assessment & Plan Note (Addendum)
 This was listed in his chart.  However CT abdomen pelvis 06/03/2023 report from [facility] noted no aneurysm of the abdominal aorta.  Reassuring findings.

## 2024-04-11 NOTE — Assessment & Plan Note (Addendum)
 Hide triglyceridemia. Recent lipid panel 03/25/2024 with HDL 41, LDL 102, triglycerides 311, total cholesterol 184.  Advised importance of dietary modifications and managing triglycerides. He did not tolerate statins due to various side effects predominantly muscle aches. Unclear but mentions he did not tolerate Zetia. Currently on fish oil  and tolerating it well.  He is currently hesitant to start any new medications as he feels he has been able to aggressively modify his diet over the last couple weeks and wants to continue doing the same. Will repeat fasting lipid panel in 3 months.  If triglycerides are not below 200 mg/dL I would recommend evaluation with lipid clinic to see if he would qualify for any lipid-lowering trials.  He is agreeable with this plan.

## 2024-04-11 NOTE — Assessment & Plan Note (Signed)
 Coronary atherosclerosis reported on CTA chest 06/03/2023 in [place] area [facility].  No prior history of MI. Lexiscan stress test nuclear imaging November 2024 at [facility] in [place]  reported no ischemia.  Continue aggressive lipid-lowering therapy with dietary modifications and medications as tolerated. Currently on aspirin  81 mg once daily, tolerating well.  Discussed about risk factor modifications, weight loss for overall cardiovascular risk reduction.

## 2024-04-11 NOTE — Assessment & Plan Note (Signed)
 Ascending aorta aortic root dilatation 4.3 cm by [facility] chest 06/03/2023 at [facility] noted aortic root measuring 4.4 cm in size gradually tapering.  Interval follow-up with repeat CT chest without contrast tentatively in January/February 2026 to be planned. Outpatient follow-up visit after the imaging study.

## 2024-04-11 NOTE — Assessment & Plan Note (Signed)
 Near optimal control. Continue hydrochlorothiazide 25 mg once daily Continue lisinopril 20 mg once daily Target below 130/80 mmHg.

## 2024-08-28 ENCOUNTER — Other Ambulatory Visit (HOSPITAL_BASED_OUTPATIENT_CLINIC_OR_DEPARTMENT_OTHER): Admitting: Radiology
